# Patient Record
Sex: Female | Born: 1985 | Marital: Single | Smoking: Never smoker
Health system: Southern US, Community
[De-identification: ages and names within clinical notes are randomized; demographics above are authoritative.]

---

## 2013-03-24 ENCOUNTER — Other Ambulatory Visit (HOSPITAL_COMMUNITY)
Admission: RE | Admit: 2013-03-24 | Discharge: 2013-03-24 | Disposition: A | Discharge status: Home / Self Care | Payer: 59 | Source: Ambulatory Visit | Attending: Internal Medicine | Admitting: Internal Medicine

## 2013-03-24 DIAGNOSIS — Z01419 Encounter for gynecological examination (general) (routine) without abnormal findings: Secondary | ICD-10-CM | POA: Insufficient documentation

## 2013-04-26 ENCOUNTER — Emergency Department (HOSPITAL_COMMUNITY)
Admission: EM | Admit: 2013-04-26 | Discharge: 2013-04-26 | Disposition: A | Discharge status: Home / Self Care | Payer: Medicaid Other | Attending: Emergency Medicine | Admitting: Emergency Medicine

## 2013-04-26 ENCOUNTER — Emergency Department (HOSPITAL_COMMUNITY): Payer: Medicaid Other

## 2013-04-26 ENCOUNTER — Encounter (HOSPITAL_COMMUNITY): Payer: Self-pay | Admitting: Emergency Medicine

## 2013-04-26 DIAGNOSIS — N939 Abnormal uterine and vaginal bleeding, unspecified: Secondary | ICD-10-CM

## 2013-04-26 DIAGNOSIS — N938 Other specified abnormal uterine and vaginal bleeding: Secondary | ICD-10-CM | POA: Insufficient documentation

## 2013-04-26 DIAGNOSIS — Z79899 Other long term (current) drug therapy: Secondary | ICD-10-CM | POA: Insufficient documentation

## 2013-04-26 DIAGNOSIS — N949 Unspecified condition associated with female genital organs and menstrual cycle: Secondary | ICD-10-CM | POA: Insufficient documentation

## 2013-04-26 DIAGNOSIS — Z3202 Encounter for pregnancy test, result negative: Secondary | ICD-10-CM | POA: Insufficient documentation

## 2013-04-26 LAB — CBC WITH DIFFERENTIAL/PLATELET
Basophils Absolute: 0 10*3/uL (ref 0.0–0.1)
Lymphocytes Relative: 38 % (ref 12–46)
Lymphs Abs: 2.9 10*3/uL (ref 0.7–4.0)
Neutro Abs: 4.1 10*3/uL (ref 1.7–7.7)
Platelets: 310 10*3/uL (ref 150–400)
RBC: 4.86 MIL/uL (ref 3.87–5.11)
RDW: 14.1 % (ref 11.5–15.5)
WBC: 7.5 10*3/uL (ref 4.0–10.5)

## 2013-04-26 LAB — POCT PREGNANCY, URINE: Preg Test, Ur: NEGATIVE

## 2013-04-26 LAB — URINALYSIS, ROUTINE W REFLEX MICROSCOPIC
Glucose, UA: NEGATIVE mg/dL
Ketones, ur: NEGATIVE mg/dL
Nitrite: NEGATIVE
Protein, ur: 30 mg/dL — AB
Specific Gravity, Urine: 1.037 — ABNORMAL HIGH (ref 1.005–1.030)
Urobilinogen, UA: 1 mg/dL (ref 0.0–1.0)
pH: 5.5 (ref 5.0–8.0)

## 2013-04-26 LAB — BASIC METABOLIC PANEL
CO2: 26 mEq/L (ref 19–32)
Chloride: 102 mEq/L (ref 96–112)
Potassium: 3.6 mEq/L (ref 3.5–5.1)
Sodium: 135 mEq/L (ref 135–145)

## 2013-04-26 LAB — URINE MICROSCOPIC-ADD ON

## 2013-04-26 NOTE — ED Notes (Signed)
Pt complains of "heavy vaginal bleeding x 2 days" Pt reports that she is on hormone therapy and was told to come in due to heavy bleeding.

## 2013-04-26 NOTE — ED Provider Notes (Signed)
History     CSN: [id number]  Arrival date & time 04/26/13  1504   First MD Initiated Contact with Patient 04/26/13 1855      Chief Complaint  Patient presents with  . Vaginal Bleeding    (Consider location/radiation/quality/duration/timing/severity/associated sxs/prior treatment) HPI Patient presents to the ED with complaints of vaginal bleeding that started two days ago. She started Yasmin one month ago and and just got to the sugar pills 4-5 days ago.  She is having cramping but no pain. Her menstrual cycle is normally light. She is going through 1 pad every 2 hours. She denies dizziness weakness, nausea, vomiting, headache or confusion. Denies a hx of the same or any previous ob/gyn problems. nad vss  History reviewed. No pertinent past medical history.  History reviewed. No pertinent past surgical history.  No family history on file.  History  Substance Use Topics  . Smoking status: Never Smoker   . Smokeless tobacco: Not on file  . Alcohol Use: No    OB History   Grav Para Term Preterm Abortions TAB SAB Ect Mult Living                  Review of Systems  Review of Systems  Gen: no weight loss, fevers, chills, night sweats  Eyes: no discharge or drainage, no occular pain or visual changes  Nose: no epistaxis or rhinorrhea  Mouth: no dental pain, no sore throat  Neck: no neck pain  Lungs:No wheezing, coughing or hemoptysis CV: no chest pain, palpitations, dependent edema or orthopnea  Abd: no abdominal pain, nausea, vomiting  GU: no dysuria or gross hematuria + vaginal bleeding MSK:  No abnormalities  Neuro: no headache, no focal neurologic deficits  Skin: no abnormalities Psyche: negative.    Allergies  Penicillins  Home Medications   Current Outpatient Rx  Name  Route  Sig  Dispense  Refill  . Chaste Tree (VITEX EXTRACT PO)   Oral   Take 2 capsules by mouth daily.         . diclofenac (VOLTAREN) 50 MG EC tablet   Oral   Take 50 mg by mouth 2  (two) times daily.         . drospirenone-ethinyl estradiol (YASMIN,ZARAH,SYEDA) 3-0.03 MG tablet   Oral   Take by mouth daily.           BP 135/67  Pulse 56  Temp(Src) 98.3 F (36.8 C) (Oral)  Resp 16  SpO2 99%  LMP 04/23/2013  Physical Exam  Nursing note and vitals reviewed. Constitutional: She appears well-developed and well-nourished. No distress.  HENT:  Head: Normocephalic and atraumatic.  Eyes: Pupils are equal, round, and reactive to light.  Neck: Normal range of motion. Neck supple.  Cardiovascular: Normal rate and regular rhythm.   Pulmonary/Chest: Effort normal.  Abdominal: Soft.  Genitourinary: Uterus normal. Cervix exhibits no motion tenderness, no discharge and no friability. There is bleeding around the vagina. No tenderness around the vagina. No foreign body around the vagina. No signs of injury around the vagina. No vaginal discharge found.  Neurological: She is alert.  Skin: Skin is warm and dry.    ED Course  Procedures (including critical care time)  Labs Reviewed  URINALYSIS, ROUTINE W REFLEX MICROSCOPIC - Abnormal; Notable for the following:    Color, Urine RED (*)    APPearance TURBID (*)    Specific Gravity, Urine 1.037 (*)    Hgb urine dipstick LARGE (*)    Bilirubin  Urine SMALL (*)    Protein, ur 30 (*)    Leukocytes, UA SMALL (*)    All other components within normal limits  CBC WITH DIFFERENTIAL - Abnormal; Notable for the following:    MCH 25.9 (*)    All other components within normal limits  URINE MICROSCOPIC-ADD ON - Abnormal; Notable for the following:    Squamous Epithelial / LPF FEW (*)    All other components within normal limits  GC/CHLAMYDIA PROBE AMP  BASIC METABOLIC PANEL  POCT PREGNANCY, URINE   US Transvaginal Non-ob  04/26/2013  *RADIOLOGY REPORT*  Clinical Data:  Severe abdominal cramping and heavy vaginal bleeding for 1 week.  TRANSABDOMINAL AND TRANSVAGINAL ULTRASOUND OF PELVIS DOPPLER ULTRASOUND OF OVARIES   Technique:  Both transabdominal and transvaginal ultrasound examinations of the pelvis were performed. Transabdominal technique was performed for global imaging of the pelvis including uterus, ovaries, adnexal regions, and pelvic cul-de-sac.  It was necessary to proceed with endovaginal exam following the transabdominal exam to visualize the endometrium and ovaries to better advantage.  Color and duplex Doppler ultrasound was utilized to evaluate blood flow to the ovaries.  Comparison:  None.  Findings:  Uterus:  Normal in size and appearance, measuring 6.3 x 3.6 x 4.1 cm.  No myometrial abnormality identified.  Endometrium:  Homogeneous appearance measuring 5.8 mm in thickness.  Right ovary: Normal appearance measuring 2.8 x 3.0 x 2.4 cm.  No adnexal mass.  Left ovary:    Normal appearance measuring 2.7 x 2.1 x 2.7 cm.  No adnexal mass.  Pulsed Doppler evaluation demonstrates normal low-resistance arterial and venous waveforms in both ovaries.  IMPRESSION: Normal exam.  No evidence of pelvic mass or other significant abnormality.  No sonographic evidence for ovarian torsion.   Original Report Authenticated By: Carey Bullocks, M.D.    US Pelvis Complete  04/26/2013  *RADIOLOGY REPORT*  Clinical Data:  Severe abdominal cramping and heavy vaginal bleeding for 1 week.  TRANSABDOMINAL AND TRANSVAGINAL ULTRASOUND OF PELVIS DOPPLER ULTRASOUND OF OVARIES  Technique:  Both transabdominal and transvaginal ultrasound examinations of the pelvis were performed. Transabdominal technique was performed for global imaging of the pelvis including uterus, ovaries, adnexal regions, and pelvic cul-de-sac.  It was necessary to proceed with endovaginal exam following the transabdominal exam to visualize the endometrium and ovaries to better advantage.  Color and duplex Doppler ultrasound was utilized to evaluate blood flow to the ovaries.  Comparison:  None.  Findings:  Uterus:  Normal in size and appearance, measuring 6.3 x 3.6 x 4.1  cm.  No myometrial abnormality identified.  Endometrium:  Homogeneous appearance measuring 5.8 mm in thickness.  Right ovary: Normal appearance measuring 2.8 x 3.0 x 2.4 cm.  No adnexal mass.  Left ovary:    Normal appearance measuring 2.7 x 2.1 x 2.7 cm.  No adnexal mass.  Pulsed Doppler evaluation demonstrates normal low-resistance arterial and venous waveforms in both ovaries.  IMPRESSION: Normal exam.  No evidence of pelvic mass or other significant abnormality.  No sonographic evidence for ovarian torsion.   Original Report Authenticated By: Carey Bullocks, M.D.    [place] Art/ven Flow Abd Pelv Doppler  04/26/2013  *RADIOLOGY REPORT*  Clinical Data:  Severe abdominal cramping and heavy vaginal bleeding for 1 week.  TRANSABDOMINAL AND TRANSVAGINAL ULTRASOUND OF PELVIS DOPPLER ULTRASOUND OF OVARIES  Technique:  Both transabdominal and transvaginal ultrasound examinations of the pelvis were performed. Transabdominal technique was performed for global imaging of the pelvis including uterus, ovaries, adnexal regions,  and pelvic cul-de-sac.  It was necessary to proceed with endovaginal exam following the transabdominal exam to visualize the endometrium and ovaries to better advantage.  Color and duplex Doppler ultrasound was utilized to evaluate blood flow to the ovaries.  Comparison:  None.  Findings:  Uterus:  Normal in size and appearance, measuring 6.3 x 3.6 x 4.1 cm.  No myometrial abnormality identified.  Endometrium:  Homogeneous appearance measuring 5.8 mm in thickness.  Right ovary: Normal appearance measuring 2.8 x 3.0 x 2.4 cm.  No adnexal mass.  Left ovary:    Normal appearance measuring 2.7 x 2.1 x 2.7 cm.  No adnexal mass.  Pulsed Doppler evaluation demonstrates normal low-resistance arterial and venous waveforms in both ovaries.  IMPRESSION: Normal exam.  No evidence of pelvic mass or other significant abnormality.  No sonographic evidence for ovarian torsion.   Original Report Authenticated By: Carey Bullocks, M.D.      1. Abnormal uterine bleeding       MDM  Patients work-up is benign. [facility] on [facility] and she recommend the patient start a new pack and skip the sugar pills until she speaks with her PCP to have medication changed.  I discussed plan with patient and she lets me know that she will not take the Yasmin ever again. I tried to explain how this will help and why she needs to take it but she refuses. I told her that it is up to her but she needs to see her PCP or referred gynecologist as soon as possible. VSS  Pt has been advised of the symptoms that warrant their return to the ED. Patient has voiced understanding and has agreed to follow-up with the PCP or specialist.         Dorthula Matas, PA-C 04/26/13 2213

## 2013-04-28 LAB — GC/CHLAMYDIA PROBE AMP
CT Probe RNA: POSITIVE — AB
GC Probe RNA: NEGATIVE

## 2013-04-29 ENCOUNTER — Telehealth (HOSPITAL_COMMUNITY): Payer: Self-pay | Admitting: Emergency Medicine

## 2013-04-29 NOTE — ED Notes (Signed)
Patient has +Chlamydia. 

## 2013-04-29 NOTE — ED Notes (Signed)
+  Chlamydia. Chart sent to EDP office for review. [facility]HS attached. 

## 2013-05-01 ENCOUNTER — Telehealth (HOSPITAL_COMMUNITY): Payer: Self-pay | Admitting: Emergency Medicine

## 2013-05-01 NOTE — ED Notes (Signed)
Chart returned from EDP office. Per Trixie Dredge PA-C, Azithromycin 1 gram PO x single dose.

## 2013-05-04 NOTE — ED Provider Notes (Signed)
Medical screening examination/treatment/procedure(s) were performed by non-physician practitioner and as supervising physician I was immediately available for consultation/collaboration.  Isael Stille, MD 05/04/13 2317 

## 2013-05-17 ENCOUNTER — Encounter: Payer: 59 | Admitting: Obstetrics & Gynecology

## 2017-09-09 DIAGNOSIS — E282 Polycystic ovarian syndrome: Secondary | ICD-10-CM | POA: Diagnosis not present

## 2017-09-09 DIAGNOSIS — N939 Abnormal uterine and vaginal bleeding, unspecified: Secondary | ICD-10-CM | POA: Diagnosis not present

## 2017-09-11 ENCOUNTER — Other Ambulatory Visit (HOSPITAL_COMMUNITY): Payer: Self-pay | Admitting: Obstetrics and Gynecology

## 2017-09-11 DIAGNOSIS — Z3141 Encounter for fertility testing: Secondary | ICD-10-CM

## 2017-09-17 ENCOUNTER — Ambulatory Visit (HOSPITAL_COMMUNITY)
Admission: RE | Admit: 2017-09-17 | Discharge: 2017-09-17 | Disposition: A | Discharge status: Home / Self Care | Payer: BLUE CROSS/BLUE SHIELD | Source: Ambulatory Visit | Attending: Obstetrics and Gynecology | Admitting: Obstetrics and Gynecology

## 2017-09-17 DIAGNOSIS — Z3141 Encounter for fertility testing: Secondary | ICD-10-CM

## 2017-09-17 MED ORDER — IOPAMIDOL (ISOVUE-300) INJECTION 61%
30.0000 mL | Freq: Once | INTRAVENOUS | Status: AC | PRN
  Administered 2017-09-17: 30 mL

## 2017-10-22 DIAGNOSIS — E282 Polycystic ovarian syndrome: Secondary | ICD-10-CM | POA: Diagnosis not present

## 2017-10-22 DIAGNOSIS — N939 Abnormal uterine and vaginal bleeding, unspecified: Secondary | ICD-10-CM | POA: Diagnosis not present

## 2017-11-11 DIAGNOSIS — Z6841 Body Mass Index (BMI) 40.0 and over, adult: Secondary | ICD-10-CM | POA: Diagnosis not present

## 2017-11-11 DIAGNOSIS — Z13220 Encounter for screening for lipoid disorders: Secondary | ICD-10-CM | POA: Diagnosis not present

## 2017-11-11 DIAGNOSIS — Z713 Dietary counseling and surveillance: Secondary | ICD-10-CM | POA: Diagnosis not present

## 2017-11-11 DIAGNOSIS — Z131 Encounter for screening for diabetes mellitus: Secondary | ICD-10-CM | POA: Diagnosis not present

## 2017-11-11 DIAGNOSIS — Z136 Encounter for screening for cardiovascular disorders: Secondary | ICD-10-CM | POA: Diagnosis not present

## 2017-11-17 DIAGNOSIS — M609 Myositis, unspecified: Secondary | ICD-10-CM | POA: Diagnosis not present

## 2018-01-17 DIAGNOSIS — Z3202 Encounter for pregnancy test, result negative: Secondary | ICD-10-CM | POA: Diagnosis not present

## 2018-01-17 DIAGNOSIS — Z113 Encounter for screening for infections with a predominantly sexual mode of transmission: Secondary | ICD-10-CM | POA: Diagnosis not present

## 2018-01-17 DIAGNOSIS — Z114 Encounter for screening for human immunodeficiency virus [HIV]: Secondary | ICD-10-CM | POA: Diagnosis not present

## 2018-01-30 DIAGNOSIS — N979 Female infertility, unspecified: Secondary | ICD-10-CM | POA: Diagnosis not present

## 2018-02-09 DIAGNOSIS — Z114 Encounter for screening for human immunodeficiency virus [HIV]: Secondary | ICD-10-CM | POA: Diagnosis not present

## 2018-02-09 DIAGNOSIS — Z0183 Encounter for blood typing: Secondary | ICD-10-CM | POA: Diagnosis not present

## 2018-02-09 DIAGNOSIS — N979 Female infertility, unspecified: Secondary | ICD-10-CM | POA: Diagnosis not present

## 2018-02-09 DIAGNOSIS — Z113 Encounter for screening for infections with a predominantly sexual mode of transmission: Secondary | ICD-10-CM | POA: Diagnosis not present

## 2018-02-09 DIAGNOSIS — Z0184 Encounter for antibody response examination: Secondary | ICD-10-CM | POA: Diagnosis not present

## 2018-02-11 DIAGNOSIS — N979 Female infertility, unspecified: Secondary | ICD-10-CM | POA: Diagnosis not present

## 2018-02-13 DIAGNOSIS — N970 Female infertility associated with anovulation: Secondary | ICD-10-CM | POA: Diagnosis not present

## 2018-03-05 DIAGNOSIS — Z3202 Encounter for pregnancy test, result negative: Secondary | ICD-10-CM | POA: Diagnosis not present

## 2018-03-05 DIAGNOSIS — N979 Female infertility, unspecified: Secondary | ICD-10-CM | POA: Diagnosis not present

## 2018-03-16 DIAGNOSIS — N979 Female infertility, unspecified: Secondary | ICD-10-CM | POA: Diagnosis not present

## 2018-05-26 DIAGNOSIS — L509 Urticaria, unspecified: Secondary | ICD-10-CM | POA: Diagnosis not present

## 2018-06-09 DIAGNOSIS — J029 Acute pharyngitis, unspecified: Secondary | ICD-10-CM | POA: Diagnosis not present

## 2018-06-16 ENCOUNTER — Encounter (HOSPITAL_COMMUNITY): Payer: Self-pay | Admitting: Emergency Medicine

## 2018-06-16 ENCOUNTER — Emergency Department (HOSPITAL_COMMUNITY)
Admission: EM | Admit: 2018-06-16 | Discharge: 2018-06-16 | Disposition: A | Discharge status: Home / Self Care | Payer: BLUE CROSS/BLUE SHIELD | Attending: Emergency Medicine | Admitting: Emergency Medicine

## 2018-06-16 ENCOUNTER — Other Ambulatory Visit: Payer: Self-pay

## 2018-06-16 ENCOUNTER — Emergency Department (HOSPITAL_COMMUNITY): Payer: BLUE CROSS/BLUE SHIELD

## 2018-06-16 DIAGNOSIS — Z79899 Other long term (current) drug therapy: Secondary | ICD-10-CM | POA: Diagnosis not present

## 2018-06-16 DIAGNOSIS — H5789 Other specified disorders of eye and adnexa: Secondary | ICD-10-CM | POA: Diagnosis not present

## 2018-06-16 DIAGNOSIS — L03213 Periorbital cellulitis: Secondary | ICD-10-CM | POA: Insufficient documentation

## 2018-06-16 DIAGNOSIS — H02843 Edema of right eye, unspecified eyelid: Secondary | ICD-10-CM | POA: Diagnosis not present

## 2018-06-16 DIAGNOSIS — H5711 Ocular pain, right eye: Secondary | ICD-10-CM | POA: Diagnosis not present

## 2018-06-16 DIAGNOSIS — H538 Other visual disturbances: Secondary | ICD-10-CM | POA: Diagnosis not present

## 2018-06-16 DIAGNOSIS — R600 Localized edema: Secondary | ICD-10-CM | POA: Diagnosis not present

## 2018-06-16 LAB — CBC WITH DIFFERENTIAL/PLATELET
Basophils Absolute: 0 10*3/uL (ref 0.0–0.1)
Basophils Relative: 0 %
EOS PCT: 0 %
Eosinophils Absolute: 0 10*3/uL (ref 0.0–0.7)
HCT: 41 % (ref 36.0–46.0)
Hemoglobin: 13.1 g/dL (ref 12.0–15.0)
LYMPHS ABS: 1.8 10*3/uL (ref 0.7–4.0)
Lymphocytes Relative: 14 %
MCH: 25.1 pg — AB (ref 26.0–34.0)
MCHC: 32 g/dL (ref 30.0–36.0)
MCV: 78.7 fL (ref 78.0–100.0)
MONOS PCT: 4 %
Monocytes Absolute: 0.4 10*3/uL (ref 0.1–1.0)
Neutro Abs: 10.2 10*3/uL — ABNORMAL HIGH (ref 1.7–7.7)
Neutrophils Relative %: 82 %
PLATELETS: 343 10*3/uL (ref 150–400)
RBC: 5.21 MIL/uL — ABNORMAL HIGH (ref 3.87–5.11)
RDW: 15.5 % (ref 11.5–15.5)
WBC: 12.5 10*3/uL — ABNORMAL HIGH (ref 4.0–10.5)

## 2018-06-16 LAB — BASIC METABOLIC PANEL WITH GFR
Anion gap: 7 (ref 5–15)
BUN: 11 mg/dL (ref 6–20)
CO2: 25 mmol/L (ref 22–32)
Calcium: 9 mg/dL (ref 8.9–10.3)
Chloride: 108 mmol/L (ref 101–111)
Creatinine, Ser: 0.89 mg/dL (ref 0.44–1.00)
GFR calc Af Amer: >60 mL/min
GFR calc non Af Amer: >60 mL/min
Glucose, Bld: 105 mg/dL — ABNORMAL HIGH (ref 65–99)
Potassium: 4 mmol/L (ref 3.5–5.1)
Sodium: 140 mmol/L (ref 135–145)

## 2018-06-16 MED ORDER — CLINDAMYCIN HCL 150 MG PO CAPS: 450.0000 mg | ORAL_CAPSULE | Freq: Three times a day (TID) | ORAL | 0 refills | Status: AC

## 2018-06-16 MED ORDER — IOHEXOL 300 MG/ML  SOLN
75.0000 mL | Freq: Once | INTRAMUSCULAR | Status: AC | PRN
  Administered 2018-06-16: 75 mL via INTRAVENOUS

## 2018-06-16 NOTE — ED Triage Notes (Signed)
Pt reports right eye swelling since she woke up this morning. Eye is painful due to swelling. Reports that she had issues with hives and prior swelling for weeks and took prednisone for it.

## 2018-06-16 NOTE — ED Provider Notes (Signed)
[facility]-EMERGENCY DEPT Provider Note   CSN: [id number] Arrival date & time: 06/16/18  1048     History   Chief Complaint Chief Complaint  Patient presents with  . Facial Swelling    HPI Vicki James is a 32 y.o. female here for evaluation of right eye swelling onset this morning when she woke up.  Associated with mild redness, itching and pain.  Pain is moderate.  Aggravated by direct palpation.  Is unable to tell if her vision is any more blurred, she wears glasses at baseline and does not have them with her today.  Reports intermittent, hives to her back and upper extremities since May.  She has been seen for this at [facility] and was prescribed prednisone however states she has been taking it differently because it was making her queasy.  She is still finishing her prednisone taper.  Since, she has developed bilateral lip swelling approximately 2 weeks ago and also had swelling to her uvula.  She was seen at an outside ER for this and was told to use lozenges.  States that hives started the day after she traveled to [place] and stated a different hotel.  Her knees had bug bites on her as well.  Other than that, she has no new exposures to pets, fluids, medications, topical agents.  She is denying sore throat, throat swelling, difficulty breathing, chest pain, shortness of breath, abdominal pain, nausea, vomiting.  No alleviating factors.  Onset was sudden. No trauma to eye.   HPI  History reviewed. No pertinent past medical history.  There are no active problems to display for this patient.   History reviewed. No pertinent surgical history.   OB History   None      Home Medications    Prior to Admission medications   Medication Sig Start Date End Date Taking? Authorizing Provider  predniSONE (DELTASONE) 10 MG tablet Take 10-20 mg by mouth daily. 05/26/18  Yes [provider]  clindamycin (CLEOCIN) 150 MG capsule Take 3 capsules (450 mg  total) by mouth 3 (three) times daily for 7 days. 06/16/18 06/23/18  Liberty Handy, PA-C    Family History No family history on file.  Social History Social History   Tobacco Use  . Smoking status: Never Smoker  . Smokeless tobacco: Never Used  Substance Use Topics  . Alcohol use: No  . Drug use: No     Allergies   Latex and Penicillins   Review of Systems Review of Systems  Eyes: Positive for pain, itching and visual disturbance.       Eye redness and swelling  All other systems reviewed and are negative.    Physical Exam Updated Vital Signs BP (!) 145/76 (BP Location: Left Arm)   Pulse 86   Temp 98.5 F (36.9 C) (Oral)   Resp 12   Ht 5\' 6"  (1.676 m)   Wt (!) 140.6 kg (310 lb)   LMP 05/04/2018   SpO2 96%   BMI 50.04 kg/m   Physical Exam  Constitutional: She is oriented to person, place, and time. She appears well-developed and well-nourished. No distress.  NAD.  HENT:  Head: Normocephalic and atraumatic.  Right Ear: External ear normal.  Left Ear: External ear normal.  Nose: Nose normal.  Eyes: Conjunctivae and EOM are normal. No scleral icterus.  Moderate edema and erythema to upper and lower eye lids, eye lid is shut. Mild local tenderness. Eye able to open eye when looking  up. Conjunctiva and sclera white without prominent vessels. No discharge. EOMS and PERRL intact bilaterally.   Neck: Normal range of motion. Neck supple.  Cardiovascular: Normal rate, regular rhythm and normal heart sounds.  No murmur heard. Pulmonary/Chest: Effort normal and breath sounds normal. She has no wheezes.  Musculoskeletal: Normal range of motion. She exhibits no deformity.  Neurological: She is alert and oriented to person, place, and time.  Skin: Skin is warm and dry. Capillary refill takes less than 2 seconds.  Psychiatric: She has a normal mood and affect. Her behavior is normal. Judgment and thought content normal.  Nursing note and vitals reviewed.    ED  Treatments / Results  Labs (all labs ordered are listed, but only abnormal results are displayed) Labs Reviewed  BASIC METABOLIC PANEL - Abnormal; Notable for the following components:      Result Value   Glucose, Bld 105 (*)    All other components within normal limits  CBC WITH DIFFERENTIAL/PLATELET - Abnormal; Notable for the following components:   WBC 12.5 (*)    RBC 5.21 (*)    MCH 25.1 (*)    Neutro Abs 10.2 (*)    All other components within normal limits  CBC WITH DIFFERENTIAL/PLATELET    EKG None  Radiology Ct Orbits W Contrast  Result Date: 06/16/2018 CLINICAL DATA:  Eyelid inflammation with right-sided redness and swelling beginning this morning EXAM: CT ORBITS WITH CONTRAST TECHNIQUE: Multidetector CT images was performed according to the standard protocol following intravenous contrast administration. CONTRAST:  75mL OMNIPAQUE IOHEXOL 300 MG/ML  SOLN COMPARISON:  None. FINDINGS: Orbits: Skin thickening and subcutaneous reticulation about the right orbit. No postseptal stranding. Normal appearance of the globes, extraocular muscles, optic nerve sheath complexes, and superior ophthalmic veins. Symmetric lacrimal gland size. Negative for collection. Visualized sinuses: Clear Soft tissues: Periorbital findings as noted above. Adenoid thickening symmetrically. Limited intracranial: Negative IMPRESSION: Preseptal swelling/cellulitis on the right.  Negative for abscess. Electronically Signed   By: Marnee Spring M.D.   On: 06/16/2018 16:16    Procedures Procedures (including critical care time)  Medications Ordered in ED Medications  iohexol (OMNIPAQUE) 300 MG/ML solution 75 mL (75 mLs Intravenous Contrast Given 06/16/18 1555)     Initial Impression / Assessment and Plan / ED Course  I have reviewed the triage vital signs and the nursing notes.  Pertinent labs & imaging results that were available during my care of the patient were reviewed by me and considered in my  medical decision making (see chart for details).  Clinical Course as of Jun 17 1735  Tue Jun 16, 2018  1725 IMPRESSION: Preseptal swelling/cellulitis on the right. Negative for abscess.    CT ORBITS W CONTRAST [CG]    Clinical Course User Index [CG] Liberty Handy, PA-C   32 year old here with right-sided eye edema, erythema, itching and pain sudden onset.  In setting of intermittent urticarial rash for the last few months, lip swelling.  Currently on prednisone taper.  She denies trauma.  No constitutional symptoms.  No sudden changes to her vision.  Does not wear contacts.  Differential diagnosis includes preseptal cellulitis versus orbital cellulitis versus allergic facial angioedema. She is already on prednisone taper. Patient discussed with Dr. Particia Nearing.  We will obtain screening labs and CT orbits to rule out infection.  1734: CT orbits show preseptal cellulitis/swelling.  No deeper infection.  Will discharge with clindamycin and strict return precautions.  She has no other facial or oral angioedema.  No  respiratory compromise.  Conjunctiva, sclera, pupils normal.  Her vision is at baseline.  No headache. Will recommend to continue prednisone taper and add antihistamine, ice.  Strict ED return precautions given.  Patient is in agreement.  Final Clinical Impressions(s) / ED Diagnoses   Final diagnoses:  Preseptal cellulitis of right eye    ED Discharge Orders        Ordered    clindamycin (CLEOCIN) 150 MG capsule  3 times daily     06/16/18 1725       Liberty HandyGibbons, Markeya Mincy J, PA-C 06/16/18 1737    Jacalyn LefevreHaviland, Julie, MD 06/17/18 [phone]

## 2018-06-16 NOTE — ED Notes (Signed)
Waldo LaineKaitlin, RN attempted IV access x1 without success

## 2018-06-16 NOTE — ED Notes (Signed)
Attempted an IV x 2 with no success. Notified Matt, RN for an ultrasound guided IV.

## 2018-06-16 NOTE — Discharge Instructions (Addendum)
You were seen in the ER for right eyelid swelling.  CT scan shows this is most likely an infectious process.  Take clindamycin as prescribed.  To help with your intermittent rash, itching continue taking the prednisone that you have been prescribed.  Additionally take a daily pepcid (famotidine) 20 mg daily. You can apply topical anti itch cream (benadryl or hydrocortisone).   Return to the ER if you have worsening eye lid swelling, or swelling to the other eye, facial lip or tongue swelling, difficulty breathing, chest pain, shortness of breath, abdominal pain, headache, vomiting

## 2018-09-07 DIAGNOSIS — E282 Polycystic ovarian syndrome: Secondary | ICD-10-CM | POA: Diagnosis not present

## 2018-09-07 DIAGNOSIS — Z0000 Encounter for general adult medical examination without abnormal findings: Secondary | ICD-10-CM | POA: Diagnosis not present

## 2018-09-07 DIAGNOSIS — Z23 Encounter for immunization: Secondary | ICD-10-CM | POA: Diagnosis not present

## 2018-09-13 ENCOUNTER — Emergency Department (HOSPITAL_COMMUNITY): Payer: BLUE CROSS/BLUE SHIELD

## 2018-09-13 ENCOUNTER — Other Ambulatory Visit: Payer: Self-pay

## 2018-09-13 ENCOUNTER — Encounter (HOSPITAL_COMMUNITY): Payer: Self-pay | Admitting: Emergency Medicine

## 2018-09-13 ENCOUNTER — Emergency Department (HOSPITAL_COMMUNITY)
Admission: EM | Admit: 2018-09-13 | Discharge: 2018-09-13 | Disposition: A | Discharge status: Home / Self Care | Payer: BLUE CROSS/BLUE SHIELD | Attending: Emergency Medicine | Admitting: Emergency Medicine

## 2018-09-13 DIAGNOSIS — S8992XA Unspecified injury of left lower leg, initial encounter: Secondary | ICD-10-CM | POA: Diagnosis not present

## 2018-09-13 DIAGNOSIS — Y92512 Supermarket, store or market as the place of occurrence of the external cause: Secondary | ICD-10-CM | POA: Insufficient documentation

## 2018-09-13 DIAGNOSIS — W19XXXA Unspecified fall, initial encounter: Secondary | ICD-10-CM

## 2018-09-13 DIAGNOSIS — Y939 Activity, unspecified: Secondary | ICD-10-CM | POA: Diagnosis not present

## 2018-09-13 DIAGNOSIS — M25562 Pain in left knee: Secondary | ICD-10-CM | POA: Insufficient documentation

## 2018-09-13 DIAGNOSIS — W010XXA Fall on same level from slipping, tripping and stumbling without subsequent striking against object, initial encounter: Secondary | ICD-10-CM | POA: Insufficient documentation

## 2018-09-13 DIAGNOSIS — M5441 Lumbago with sciatica, right side: Secondary | ICD-10-CM | POA: Insufficient documentation

## 2018-09-13 DIAGNOSIS — Y999 Unspecified external cause status: Secondary | ICD-10-CM | POA: Diagnosis not present

## 2018-09-13 DIAGNOSIS — M25551 Pain in right hip: Secondary | ICD-10-CM

## 2018-09-13 DIAGNOSIS — M25561 Pain in right knee: Secondary | ICD-10-CM | POA: Diagnosis not present

## 2018-09-13 DIAGNOSIS — S79911A Unspecified injury of right hip, initial encounter: Secondary | ICD-10-CM | POA: Diagnosis not present

## 2018-09-13 DIAGNOSIS — S8991XA Unspecified injury of right lower leg, initial encounter: Secondary | ICD-10-CM | POA: Diagnosis not present

## 2018-09-13 LAB — PREGNANCY, URINE: PREG TEST UR: NEGATIVE

## 2018-09-13 MED ORDER — IBUPROFEN 600 MG PO TABS: 600.0000 mg | ORAL_TABLET | Freq: Four times a day (QID) | ORAL | 0 refills | Status: AC | PRN

## 2018-09-13 MED ORDER — ACETAMINOPHEN 500 MG PO TABS: 500.0000 mg | ORAL_TABLET | Freq: Four times a day (QID) | ORAL | 0 refills | Status: AC | PRN

## 2018-09-13 NOTE — ED Triage Notes (Signed)
Pt slipped and fell on grocery store floor onto both knees. C/o bilateral knee pain.

## 2018-09-13 NOTE — Discharge Instructions (Signed)
Your x-rays today did not show any sign of fracture or dislocation.  1. Medications: Alternate 600 mg of ibuprofen and [phone] mg of Tylenol every 3 hours as needed for pain. Do not exceed 4000 mg of Tylenol daily.  Take ibuprofen with food to avoid upset stomach issues.  2. Treatment: rest, ice, elevate when you are not walking and use brace and crutches for comfort when you are walking, drink plenty of fluids, gentle stretching (I have attached some exercises but you can also YouTube physical therapy exercises for the back, hip, and knee) 3. Follow Up: Please followup with orthopedics as directed or your PCP in 1 week if no improvement for discussion of your diagnoses and further evaluation after today's visit; if you do not have a primary care doctor use the resource guide provided to find one; Please return to the ER for worsening symptoms or other concerns such as worsening swelling, redness of the skin, fevers, loss of pulses, or loss of feeling

## 2018-09-13 NOTE — ED Provider Notes (Signed)
[facility]-EMERGENCY DEPT Provider Note   CSN: [id number] Arrival date & time: 09/13/18  1707     History   Chief Complaint Chief Complaint  Patient presents with  . Fall  . Knee Pain    bilateral knees    HPI Vicki James is a 32 y.o. female presents today for evaluation of acute onset, constant bilateral knee pain, right worse than left secondary to fall which occurred around 3 PM today.  She states that she was at a grocery store where there was apparently some watermelon juice on the floor which she did not notice.  She states that she slipped on the juice, landing on her right knee.  Denies head injury or loss of consciousness.  She notes pain to the left knee is mild and is more severe on the right.  Pain on the right radiates up to the right hip and low back and down to the toes.  She notes numbness and tingling to the anterior aspect of the right knee.  Pain worsens with bending and attempts to ambulate.  She denies bowel or bladder incontinence, saddle anesthesia, fevers.  No medications for this prior to arrival.  The history is provided by the patient.    History reviewed. No pertinent past medical history.  There are no active problems to display for this patient.   History reviewed. No pertinent surgical history.   OB History   None      Home Medications    Prior to Admission medications   Medication Sig Start Date End Date Taking? Authorizing Provider  acetaminophen (TYLENOL) 500 MG tablet Take 1 tablet (500 mg total) by mouth every 6 (six) hours as needed. 09/13/18   Miyonna Ormiston A, PA-C  ibuprofen (ADVIL,MOTRIN) 600 MG tablet Take 1 tablet (600 mg total) by mouth every 6 (six) hours as needed. 09/13/18   Jeoffrey Eleazer A, PA-C  predniSONE (DELTASONE) 10 MG tablet Take 10-20 mg by mouth daily. 05/26/18   [provider]    Family History History reviewed. No pertinent family history.  Social History Social History   Tobacco Use    . Smoking status: Never Smoker  . Smokeless tobacco: Never Used  Substance Use Topics  . Alcohol use: No  . Drug use: No     Allergies   Latex and Penicillins   Review of Systems Review of Systems  Constitutional: Negative for fever.  Musculoskeletal: Positive for arthralgias and back pain.  Neurological: Positive for numbness. Negative for syncope, weakness and headaches.     Physical Exam Updated Vital Signs BP (!) 157/92 (BP Location: Left Arm)   Pulse 80   Temp 98.3 F (36.8 C) (Oral)   Resp 20   Ht 5\' 8"  (1.727 m)   Wt (!) 140.2 kg   LMP 09/01/2018 (Exact Date)   SpO2 100%   BMI 46.98 kg/m   Physical Exam  Constitutional: She is oriented to person, place, and time. She appears well-developed and well-nourished. No distress.  Obese female, resting comfortably in chair  HENT:  Head: Normocephalic and atraumatic.  Eyes: Conjunctivae are normal. Right eye exhibits no discharge. Left eye exhibits no discharge.  Neck: No JVD present. No tracheal deviation present.  Cardiovascular: Normal rate and intact distal pulses.  2+ DP/PT pulses bilaterally, Homans sign absent bilaterally, no lower extremity edema, no palpable cords, compartments are soft   Pulmonary/Chest: Effort normal.  Abdominal: She exhibits no distension.  Musculoskeletal: She exhibits tenderness. She exhibits no  edema.       Right hip: She exhibits normal strength, no crepitus and no deformity.       Right knee: She exhibits decreased range of motion. She exhibits no ecchymosis, no deformity, no laceration, no erythema, normal alignment, no LCL laxity, normal patellar mobility, normal meniscus and no MCL laxity. Tenderness found. Medial joint line, lateral joint line, MCL, LCL and patellar tendon tenderness noted.       Left knee: She exhibits normal range of motion, no ecchymosis, no deformity, no laceration, no erythema, normal alignment, no LCL laxity, normal patellar mobility, normal meniscus and no  MCL laxity. Tenderness found. Medial joint line, lateral joint line and patellar tendon tenderness noted.  No midline spine tenderness, right paralumbar muscle tenderness noted in right SI joint tenderness noted.  5/5 strength of BLE major muscle groups.  There is diffuse tenderness to palpation of the right knee and tenderness palpation of the anterior aspect of the left knee overlying the patella.  No varus or valgus instability, negative anterior/posterior drawer test bilaterally.  Decreased range of motion with flexion of the right knee secondary to pain.  Patient is able to extend both knees against gravity.  No quadriceps tendon deformity.  There is tenderness to palpation of the lateral and anterior aspect of the right hip, pain worsens with flexion, internal and external rotation of the right hip.  Neurological: She is alert and oriented to person, place, and time. No cranial nerve deficit. She exhibits normal muscle tone.  Fluent speech, no facial droop, altered sensation to soft touch of the anterior aspect of the right knee.  Otherwise sensation intact to soft touch of extremities.  Ambulates with an antalgic gait, has difficulty with Heel Walk and Toe Walk secondary to right knee pain.  Skin: Skin is warm and dry. No erythema.  Psychiatric: She has a normal mood and affect. Her behavior is normal.  Nursing note and vitals reviewed.    ED Treatments / Results  Labs (all labs ordered are listed, but only abnormal results are displayed) Labs Reviewed  PREGNANCY, URINE  POC URINE PREG, ED    EKG None  Radiology Dg Knee Complete 4 Views Left  Result Date: 09/13/2018 CLINICAL DATA:  Patient slipped and fell on grocery store floor onto both knees. EXAM: LEFT KNEE - COMPLETE 4+ VIEW COMPARISON:  None. FINDINGS: No evidence of fracture, dislocation, or joint effusion. No evidence of arthropathy or other focal bone abnormality. Soft tissues are unremarkable. IMPRESSION: Negative.  Electronically Signed   By: Elberta Fortis M.D.   On: 09/13/2018 18:34   Dg Knee Complete 4 Views Right  Result Date: 09/13/2018 CLINICAL DATA:  Slipped and fell in grocery store for with bilateral knee pain. EXAM: RIGHT KNEE - COMPLETE 4+ VIEW COMPARISON:  None. FINDINGS: No evidence of fracture, dislocation, or joint effusion. No evidence of arthropathy or other focal bone abnormality. Soft tissues are unremarkable. IMPRESSION: Negative. Electronically Signed   By: Elberta Fortis M.D.   On: 09/13/2018 18:35   Dg Hip Unilat With Pelvis 2-3 Views Right  Result Date: 09/13/2018 CLINICAL DATA:  Slip and fall injury. Bilateral knee pain. Right hip pain. EXAM: DG HIP (WITH OR WITHOUT PELVIS) 2-3V RIGHT COMPARISON:  None. FINDINGS: There is no evidence of hip fracture or dislocation. There is no evidence of arthropathy or other focal bone abnormality. IMPRESSION: Negative. Electronically Signed   By: Burman Nieves M.D.   On: 09/13/2018 21:35    Procedures Procedures (including  critical care time)  Medications Ordered in ED Medications - No data to display   Initial Impression / Assessment and Plan / ED Course  I have reviewed the triage vital signs and the nursing notes.  Pertinent labs & imaging results that were available during my care of the patient were reviewed by me and considered in my medical decision making (see chart for details).     Patient with bilateral knee and right hip pain secondary to mechanical fall.  She is afebrile, mildly hypertensive in the ED though does appear uncomfortable.  She is neurovascularly intact and ambulatory without difficulty.  Radiographs show no acute osseous abnormality involving the knees or right hip.  No midline spine tenderness.  No concern for DVT, septic joint, osteomyelitis. Pt advised to follow up with orthopedics if symptoms persist for possibility of missed fracture diagnosis. Patient given brace and crutches while in ED, conservative therapy  recommended and discussed.  Discussed strict ED return precautions. Pt verbalized understanding of and agreement with plan and is safe for discharge home at this time.   Final Clinical Impressions(s) / ED Diagnoses   Final diagnoses:  Fall, initial encounter  Acute pain of both knees  Acute right hip pain  Acute right-sided low back pain with right-sided sciatica    ED Discharge Orders         Ordered    ibuprofen (ADVIL,MOTRIN) 600 MG tablet  Every 6 hours PRN     09/13/18 2144    acetaminophen (TYLENOL) 500 MG tablet  Every 6 hours PRN     09/13/18 2144           Jeanie SewerFawze, Da Michelle A, PA-C 09/14/18 1449    Loren RacerYelverton, David, MD 09/18/18 1709

## 2018-09-15 DIAGNOSIS — M25561 Pain in right knee: Secondary | ICD-10-CM | POA: Diagnosis not present

## 2018-09-16 ENCOUNTER — Ambulatory Visit (INDEPENDENT_AMBULATORY_CARE_PROVIDER_SITE_OTHER): Payer: BLUE CROSS/BLUE SHIELD | Admitting: Family Medicine

## 2018-09-16 ENCOUNTER — Encounter (INDEPENDENT_AMBULATORY_CARE_PROVIDER_SITE_OTHER): Payer: Self-pay | Admitting: Family Medicine

## 2018-09-16 ENCOUNTER — Other Ambulatory Visit (INDEPENDENT_AMBULATORY_CARE_PROVIDER_SITE_OTHER): Payer: Self-pay

## 2018-09-16 ENCOUNTER — Ambulatory Visit (INDEPENDENT_AMBULATORY_CARE_PROVIDER_SITE_OTHER): Payer: Self-pay

## 2018-09-16 DIAGNOSIS — M25561 Pain in right knee: Secondary | ICD-10-CM

## 2018-09-16 DIAGNOSIS — M5441 Lumbago with sciatica, right side: Secondary | ICD-10-CM

## 2018-09-16 DIAGNOSIS — M25562 Pain in left knee: Secondary | ICD-10-CM | POA: Diagnosis not present

## 2018-09-16 MED ORDER — BACLOFEN 10 MG PO TABS: 10.0000 mg | ORAL_TABLET | Freq: Three times a day (TID) | ORAL | 3 refills | Status: AC | PRN

## 2018-09-16 MED ORDER — PREDNISONE 10 MG PO TABS: ORAL_TABLET | ORAL | 0 refills | Status: AC

## 2018-09-16 NOTE — Progress Notes (Signed)
   Office Visit Note   Patient: Vicki James           Date of Birth: [birth year]           MRN: [id number] Visit Date: 09/16/2018 Requested by: Darrin Nipperollege, [facility] [place] [address] [place], [place] [id number] PCP: Darrin Nipperollege, [facility] [place]  Subjective: Chief Complaint  Patient presents with  . Lower Back - Pain  . Right Hip - Pain  . Right Knee - Pain  . Larey SeatFell 09/12/18 while grocery shopping    HPI: She is a 32 year old with low back, right hip and knee pain.  On September 14 she was walking through the grocery store, slipped on wet floor and fell landing on her right side.  Immediate pain in both knees and in the lateral right hip.  She has some lower back pain as well.  Able to get up and bear weight, but walking with a limp.  She went to the ER where x-rays of her hip and knees were obtained and were negative for acute fracture.  We reviewed those films this afternoon.  No previous problems with these areas.  She was given muscle relaxant and tramadol along with ibuprofen.  Does not seem to be improving so far.  She has not been able to return to work.  She works at [employer].              ROS: She has PCOS, all other systems were reviewed and are negative.  Objective: Vital Signs: LMP 09/01/2018 (Exact Date)   Physical Exam:  Low back: No visible bruising.  She is tender over the L5-S1 area and in the right sciatic notch.  Slight tenderness over the right greater trochanter.  Lower extremity strength and reflexes are normal. Right hip: Good range of motion with internal rotation. Right knee: No effusion, ligaments feel stable.  Limited flexion because of pain.  Tender around the medial and lateral joint lines.  Imaging: Lumbar spine x-rays: Normal anatomic alignment, no obvious fracture.  Disc spaces are well-preserved.  No congenital deformities.   Assessment & Plan: 1.  About 4 days status post fall with low back and right leg pain, possible  lumbar disc protrusion -Physical therapy referral, prednisone taper, muscle relaxant as needed. -MRI scan lumbar spine if symptoms persist.   Follow-Up Instructions: No follow-ups on file.       Procedures: None today.   PMFS History: There are no active problems to display for this patient.  History reviewed. No pertinent past medical history.  History reviewed. No pertinent family history.  History reviewed. No pertinent surgical history. Social History   Occupational History  . Not on file  Tobacco Use  . Smoking status: Never Smoker  . Smokeless tobacco: Never Used  Substance and Sexual Activity  . Alcohol use: No  . Drug use: No  . Sexual activity: Not on file

## 2018-09-23 ENCOUNTER — Encounter (INDEPENDENT_AMBULATORY_CARE_PROVIDER_SITE_OTHER): Payer: Self-pay

## 2018-09-23 ENCOUNTER — Ambulatory Visit (INDEPENDENT_AMBULATORY_CARE_PROVIDER_SITE_OTHER): Payer: BLUE CROSS/BLUE SHIELD | Admitting: Family Medicine

## 2018-09-23 ENCOUNTER — Encounter (INDEPENDENT_AMBULATORY_CARE_PROVIDER_SITE_OTHER): Payer: Self-pay | Admitting: Family Medicine

## 2018-09-23 DIAGNOSIS — M25551 Pain in right hip: Secondary | ICD-10-CM | POA: Diagnosis not present

## 2018-09-23 DIAGNOSIS — M5441 Lumbago with sciatica, right side: Secondary | ICD-10-CM

## 2018-09-23 MED ORDER — ACETAMINOPHEN-CODEINE #3 300-30 MG PO TABS: 1.0000 | ORAL_TABLET | Freq: Four times a day (QID) | ORAL | 0 refills | Status: AC | PRN

## 2018-09-23 NOTE — Progress Notes (Signed)
   Office Visit Note   Patient: Vicki James           Date of Birth: 11-Feb-1986           MRN: [id number] Visit Date: 09/23/2018 Requested by: Darrin Nipperollege, [facility] [place] [address] [place], [place] [id number] PCP: Darrin Nipperollege, [facility] [place]  Subjective: Chief Complaint  Patient presents with  . Left Knee - Follow-up    Patient fell down the stairs on 09/22/2018.   Marland Kitchen. Pelvic Muscle Pain    HPI: She is here with worsening low back and right leg pain.  Yesterday while walking, she felt numbness in her foot and sudden severe pain in her right hip and thigh, her leg gave out and she fell down some steps.  She has had increasing pain since then.  She remains out of work.  Physical therapy has been scheduled to start next week.  She is still wearing her knee immobilizer most of the time.  Tramadol seems to be given her hot flashes.              ROS: Noncontributory  Objective: Vital Signs: LMP 09/01/2018 (Exact Date)   Physical Exam:  Back: Still tender to palpation in the right sciatic notch area and near the L5-S1 region.  Straight leg raise is equivocal.  Quite a bit of pain with passive internal hip rotation although her range of motion remains good.  She has pain with hip flexion and adduction against resistance.  Good strength with knee extension, ankle dorsiflexion and plantarflexion.  Imaging: None today.  Assessment & Plan: 1.  Worsening low back and right leg pain with numbness and weakness subjectively, right groin pain. -MRI lumbar spine to look for disc herniation.  MRI pelvis to look for abductor tendon tear.  Out of work until MRI results are available. -Tylenol 3 for pain.   Follow-Up Instructions: No follow-ups on file.       Procedures: None today.   PMFS History: There are no active problems to display for this patient.  History reviewed. No pertinent past medical history.  History reviewed. No pertinent family history.    History reviewed. No pertinent surgical history. Social History   Occupational History  . Not on file  Tobacco Use  . Smoking status: Never Smoker  . Smokeless tobacco: Never Used  Substance and Sexual Activity  . Alcohol use: No  . Drug use: No  . Sexual activity: Not on file

## 2018-09-30 ENCOUNTER — Other Ambulatory Visit: Payer: Self-pay

## 2018-09-30 ENCOUNTER — Ambulatory Visit: Payer: BLUE CROSS/BLUE SHIELD | Attending: Family Medicine | Admitting: Physical Therapy

## 2018-09-30 DIAGNOSIS — R262 Difficulty in walking, not elsewhere classified: Secondary | ICD-10-CM | POA: Diagnosis not present

## 2018-09-30 DIAGNOSIS — M5441 Lumbago with sciatica, right side: Secondary | ICD-10-CM | POA: Insufficient documentation

## 2018-09-30 DIAGNOSIS — R29898 Other symptoms and signs involving the musculoskeletal system: Secondary | ICD-10-CM | POA: Diagnosis not present

## 2018-09-30 DIAGNOSIS — R2689 Other abnormalities of gait and mobility: Secondary | ICD-10-CM | POA: Diagnosis not present

## 2018-09-30 DIAGNOSIS — M25551 Pain in right hip: Secondary | ICD-10-CM | POA: Diagnosis not present

## 2018-09-30 DIAGNOSIS — M25561 Pain in right knee: Secondary | ICD-10-CM | POA: Diagnosis not present

## 2018-09-30 NOTE — Therapy (Signed)
[facility] [address]  [address] [place], [place], [address] Phone: [phone]   Fax:  [phone]  Physical Therapy Evaluation  Patient Details  Name: Vicki James MRN: [id number] Date of Birth: 13-Feb-1986 Referring Provider (PT): Lawernce Keas, MD   Encounter Date: 09/30/2018  PT End of Session - 09/30/18 1324    Visit Number  1    Number of Visits  12    Date for PT Re-Evaluation  11/11/18    Authorization Type  BCBS    Authorization - Number of Visits  90    PT Start Time  0922    PT Stop Time  1028    PT Time Calculation (min)  66 min    Equipment Utilized During Treatment  Right knee immobilizer    Activity Tolerance  Patient limited by pain    Behavior During Therapy  [facility] for tasks assessed/performed       No past medical history on file.  No past surgical history on file.  There were no vitals filed for this visit.   Subjective Assessment - 09/30/18 0928    Subjective  Pt reports she was shopping at [employer] on 09/13/18, when she slipped in wet floor falling in a split fashion going down on R knee and then falling to onto R side. Noted pain in R knee, hip/groin and R side/low back and states MD told her she may have strained her "groin muscle". Pain caused her R knee to buckle last week leading to a fall down the stairs. Pt arrives to PT in R knee immobilizer positoned below knee around lower leg and resting on ankle. Pt also reporting she has crutches which she did not bring to PT because they hurt her arms to use them.    Limitations  Standing;Walking;House hold activities    How long can you stand comfortably?  5-10 min    How long can you walk comfortably?  5-10 min    Diagnostic tests  MRI for low back & hip/groin scheduled for 10/09/18    Patient Stated Goals  "pain to go away & do what I was able to do before w/p pain"    Currently in Pain?  Yes    Pain Score  8    up to 10/10   Pain Location  Groin     Pain Orientation  Right    Pain Descriptors / Indicators  --   "pulling & ripping"   Pain Type  Acute pain    Pain Radiating Towards  pain down inner R thigh down to foot, with numb/burning sensation in foot    Pain Onset  1 to 4 weeks ago    Pain Frequency  Constant    Aggravating Factors   walking, prolonged standing, sleeping    Pain Relieving Factors  Tramadol, ibprofen, Tylenol    Effect of Pain on Daily Activities  has to sleep on L side, but sleep still disrupted; limited standing tolerance; unable to drive    Multiple Pain Sites  Yes    Pain Score  0   up to 10/10   Pain Location  Back   & posterior hip   Pain Orientation  Right;Posterior;Lower    Pain Descriptors / Indicators  Sharp    Pain Type  Acute pain    Pain Radiating Towards  numbness & burning into foot    Pain Onset  1 to 4 weeks ago  Pain Frequency  Intermittent    Aggravating Factors   bending over; sleeping on back or R side; stairs    Pain Relieving Factors  Tramadol, ibprofen, Tylenol    Effect of Pain on Daily Activities  difficulty negotiating stairs to 3rd floor condo    Pain Score  7    Pain Location  Knee    Pain Orientation  Right;Anterior    Pain Descriptors / Indicators  Numbness;Burning    Pain Type  Acute pain    Pain Onset  1 to 4 weeks ago    Pain Frequency  Constant    Aggravating Factors   elevating R leg, prolonged standing or walking, climbing stairs    Pain Relieving Factors  Tramadol, ibprofen, Tylenol    Effect of Pain on Daily Activities  limited standing/walking tolerance; difficulty climbing stairs         [facility] PT Assessment - 09/30/18 0922      Assessment   Medical Diagnosis  Acute R low back, hip, groin & knee pain s/p fall    Referring Provider (PT)  Lawernce Keas, MD    Onset Date/Surgical Date  09/13/18    Next MD Visit  TBD after MRI      Precautions   Required Braces or Orthoses  Knee Immobilizer - Right    Knee Immobilizer - Right  --   used as needed for  stability     Balance Screen   Has the patient fallen in the past 6 months  Yes    How many times?  2    Has the patient had a decrease in activity level because of a fear of falling?   Yes    Is the patient reluctant to leave their home because of a fear of falling?   Yes      Home Environment   Living Environment  Private residence    Living Arrangements  Spouse/significant other    Type of Home  --   Condo   Home Access  Stairs to enter    Entrance Stairs-Number of Steps  3 flights    Entrance Stairs-Rails  Left    Home Layout  One level    Home Equipment  Crutches      Prior Function   Level of Independence  Independent    Vocation  Full time employment   out of work d/t injury   Programmer, [employer] - mostly sitting; long walk to parking    Leisure  shopping, traveling, had plans to start working out (bought gym memebership) but had not started prior to injury      Posture/Postural Control   Posture/Postural Control  Postural limitations    Postural Limitations  Anterior pelvic tilt;Flexed trunk;Increased lumbar lordosis;Weight shift left      ROM / Strength   AROM / PROM / Strength  AROM;Strength      AROM   Overall AROM   Unable to assess;Due to pain    Overall AROM Comments  Unable to assess lumbar ROM due to poor standing tolerance, as well as hip & knee ROM due to limited positonal tolerance in sitting, supine or L sidelying    AROM Assessment Site  Lumbar;Hip;Knee    Right/Left Knee  Right;Left    Right Knee Extension  35   in LAQ; 0 dg extension when suported in supine   Left Knee Extension  0      Strength   Overall Strength  Unable  to assess;Due to pain      Flexibility   Soft Tissue Assessment /Muscle Length  yes    Hamstrings  unable to tolerate assessment    Quadriceps  mod tightness B - pt reporting "burning" sensation in anterior thigh during passive knee flexion in prone    ITB  unable to tolerate assessment    Piriformis   unable to tolerate assessment      Palpation   Palpation comment  Pt denies ttp over lumbar paraspinals. Increased muscle tension with very ttp over R glutes (esp glute min/med), R trochanteric bursa, R TFL/ITB, R hip flexors/quads & R hip adductors.                Objective measurements completed on examination: See above findings.      [facility] Adult PT Treatment/Exercise - 09/30/18 0922      Modalities   Modalities  Electrical Stimulation;Moist Heat      Moist Heat Therapy   Number Minutes Moist Heat  15 Minutes    Moist Heat Location  Lumbar Spine;Hip   buttocks & R groin/hip adductors     Electrical Stimulation   Electrical Stimulation Location  R glutes & adductors    Electrical Stimulation Action  IFC    Electrical Stimulation Parameters  80-150 Hz, intensity to pt tol x15'    Electrical Stimulation Goals  Pain;Tone             PT Education - 09/30/18 1014    Education Details  PT eval findings, anticipated POC, proper application and use of R knee immobilizer, proper height adjustment and use of crutches    Person(s) Educated  Patient    Methods  Explanation;Demonstration    Comprehension  Verbalized understanding       PT Short Term Goals - 09/30/18 1028      PT SHORT TERM GOAL #1   Title  Independent with initial HEP     Status  New    Target Date  10/21/18      PT SHORT TERM GOAL #2   Title  Patient to demonstrate improved tissue quality and pliability with reduced pain     Status  New    Target Date  10/21/18        PT Long Term Goals - 09/30/18 1028      PT LONG TERM GOAL #1   Title  Independet with ongoing/adavanced HEP    Status  New    Target Date  11/11/18      PT LONG TERM GOAL #2   Title  Patient to report pain reduction in frequency and intensity by >/= 50%     Status  New    Target Date  11/11/18      PT LONG TERM GOAL #3   Title  Patient to demonstrate appropriate posture and body mechanics needed for daily activities     Status  New    Target Date  11/11/18      PT LONG TERM GOAL #4   Title  Patient to report ability to perform work and daily activities without pain provocation    Status  New    Target Date  11/11/18             Plan - 09/30/18 1015    Clinical Impression Statement  Vicki James is a 32 y/o female who presents to OP PT with R sided hip, knee and low back pain following a slip and fall on a wet  floor while shopping at [employer] on 09/13/18. She reports groin and inner thigh pain is the worst, but also reports radicular numbness and burning down R LE to foot. Pain severely limits functional mobility, gait and positional tolerance with daily activities such as sleeping. Pain also preventing completion of thorough ROM and strength assessment for low back and lower extremities as well as overall musculoskeletal system as pt only able to tolerate prone lying with distal R LE slightly off edge of table, and unable to allow PROM or special testing due to extensive guarding. Estim and moist applied in effort to reduce pain and allow for more complete assessment, but pt only noting slight improvement in pain with this. Education provided in proper use of knee immobilizer as well as proper set-up and use of crutches using clinic set - requested pt bring her crutches to next session to verify proper set-up and use of crutches. Future PT will focus on pain management to allow for further assessment of musculoskeletal impairments, allowing PT to address these deficits to decrease pain interference with daily activities.    Clinical Presentation  Evolving    Clinical Decision Making  Low    Rehab Potential  Fair    Clinical Impairments Affecting Rehab Potential  morbid obesity; limited positional tolerance    PT Frequency  2x / week    PT Duration  6 weeks    PT Treatment/Interventions  ADLs/Self Care Home Management;Cryotherapy;Electrical Stimulation;Iontophoresis 4mg /ml Dexamethasone;Moist  Heat;Traction;Ultrasound;Gait training;Stair training;Functional mobility training;Therapeutic activities;Therapeutic exercise;Balance training;Neuromuscular re-education;Patient/family education;Manual techniques;Passive range of motion;Dry needling;Taping;Vasopneumatic Device    Consulted and Agree with Plan of Care  Patient       Patient will benefit from skilled therapeutic intervention in order to improve the following deficits and impairments:  Pain, Increased muscle spasms, Impaired flexibility, Decreased range of motion, Decreased strength, Decreased activity tolerance, Decreased mobility, Difficulty walking, Abnormal gait, Improper body mechanics, Postural dysfunction  Visit Diagnosis: Pain in right hip  Acute right-sided low back pain with right-sided sciatica  Acute pain of right knee  Difficulty in walking, not elsewhere classified  Other abnormalities of gait and mobility  Other symptoms and signs involving the musculoskeletal system     Problem List There are no active problems to display for this patient.   Marry Guan, PT, MPT 09/30/2018, 1:12 PM  [facility] [address]  [address] [place], [place], [address] Phone: [phone]   Fax:  [phone]  Name: Kamrin Spath MRN: [id number] Date of Birth: [birth year]

## 2018-10-05 ENCOUNTER — Ambulatory Visit: Payer: BLUE CROSS/BLUE SHIELD | Admitting: Physical Therapy

## 2018-10-07 ENCOUNTER — Ambulatory Visit: Payer: BLUE CROSS/BLUE SHIELD | Admitting: Physical Therapy

## 2018-10-08 ENCOUNTER — Other Ambulatory Visit: Payer: BLUE CROSS/BLUE SHIELD

## 2018-10-09 ENCOUNTER — Ambulatory Visit
Admission: RE | Admit: 2018-10-09 | Discharge: 2018-10-09 | Disposition: A | Discharge status: Home / Self Care | Payer: BLUE CROSS/BLUE SHIELD | Source: Ambulatory Visit | Attending: Family Medicine | Admitting: Family Medicine

## 2018-10-09 DIAGNOSIS — M545 Low back pain: Secondary | ICD-10-CM | POA: Diagnosis not present

## 2018-10-09 DIAGNOSIS — M25551 Pain in right hip: Secondary | ICD-10-CM

## 2018-10-09 DIAGNOSIS — M5441 Lumbago with sciatica, right side: Secondary | ICD-10-CM

## 2018-10-12 ENCOUNTER — Telehealth (INDEPENDENT_AMBULATORY_CARE_PROVIDER_SITE_OTHER): Payer: Self-pay | Admitting: Family Medicine

## 2018-10-12 NOTE — Telephone Encounter (Signed)
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  Important to work on aggressive weight loss for long-term spine health.  No pinched nerves seen, no ruptured discs.  No need for surgery.    Hip MRI is normal.  Pain is probably from bruise or muscle strain, and should resolve with time and continued physical therapy.

## 2018-10-12 NOTE — Telephone Encounter (Signed)
Advised the patient of the MRI results.  She has an appointment on 10/16 with Dr. Prince Rome for followup and she  would like to keep this appointment.

## 2018-10-13 ENCOUNTER — Ambulatory Visit: Payer: BLUE CROSS/BLUE SHIELD | Admitting: Physical Therapy

## 2018-10-13 DIAGNOSIS — M25561 Pain in right knee: Secondary | ICD-10-CM | POA: Diagnosis not present

## 2018-10-13 DIAGNOSIS — M5441 Lumbago with sciatica, right side: Secondary | ICD-10-CM | POA: Diagnosis not present

## 2018-10-13 DIAGNOSIS — M25551 Pain in right hip: Secondary | ICD-10-CM

## 2018-10-13 DIAGNOSIS — R262 Difficulty in walking, not elsewhere classified: Secondary | ICD-10-CM | POA: Diagnosis not present

## 2018-10-13 DIAGNOSIS — R2689 Other abnormalities of gait and mobility: Secondary | ICD-10-CM

## 2018-10-13 DIAGNOSIS — R29898 Other symptoms and signs involving the musculoskeletal system: Secondary | ICD-10-CM | POA: Diagnosis not present

## 2018-10-13 NOTE — Therapy (Signed)
[facility] [address]  [address] [place], [place], [address] Phone: [phone]   Fax:  [phone]  MD progress note for Physical Therapy Treatment  Patient Details  Name: Vicki James MRN: [id number] Date of Birth: [birth year] Referring Provider (PT): Lawernce Keas, MD   Encounter Date: 10/13/2018  PT End of Session - 10/13/18 1652    Visit Number  2    Number of Visits  12    Date for PT Re-Evaluation  11/11/18    Authorization Type  BCBS    Authorization - Number of Visits  90    PT Start Time  1620    PT Stop Time  1659    PT Time Calculation (min)  39 min    Activity Tolerance  Patient limited by pain    Behavior During Therapy  [facility] for tasks assessed/performed       No past medical history on file.  No past surgical history on file.  There were no vitals filed for this visit.  Subjective Assessment - 10/13/18 1700    Subjective  Pt relays she still has a lot of back/hip/groin/knee pain and radiculopathy. She relays this was aggravated by getting in MRI machine recently for back/hip MRI    Pain Score  7     Pain Location  Groin   groin, back, hip   Pain Orientation  Right    Pain Descriptors / Indicators  Aching;Numbness;Throbbing    Pain Type  Acute pain    Pain Radiating Towards  Rt leg into foot    Pain Onset  1 to 4 weeks ago    Pain Frequency  Constant         [facility] PT Assessment - 10/13/18 0001      Assessment   Medical Diagnosis  Acute R low back, hip, groin & knee pain s/p fall    Referring Provider (PT)  Lawernce Keas, MD    Onset Date/Surgical Date  09/13/18    Next MD Visit  10/14/18      AROM   AROM Assessment Site  Lumbar    Lumbar Flexion  75%    Lumbar Extension  25%    Lumbar - Right Side Bend  25%    Lumbar - Left Side Bend  WNL    Lumbar - Right Rotation  25%    Lumbar - Left Rotation  50%      Strength   Overall Strength  Deficits   Leg strength overall 3+/5 grossly  but limited by pain                  [facility] Adult PT Treatment/Exercise - 10/13/18 0001      Exercises   Exercises  Lumbar      Lumbar Exercises: Stretches   Active Hamstring Stretch  Right;30 seconds;3 reps   complaint of leg numbness on 3rd rep   Other Lumbar Stretch Exercise  seated lumbar P ball roll outs for flexion 5 sec X 15      Lumbar Exercises: Aerobic   Nustep  3 min (stopped due to knee pain)      Lumbar Exercises: Seated   Long Arc Quad on Chair  10 reps;Right;Left   complains of pulling in her Rt inner thigh   Other Seated Lumbar Exercises  seated marches X 10 bilat   limited ROM due to pain   Other Seated Lumbar Exercises  seated add isometric ball squeeze 5  sec X 15      Lumbar Exercises: Sidelying   Clam  10 reps   for Rt hip     Modalities   Modalities  Electrical Stimulation;Moist Heat      Moist Heat Therapy   Number Minutes Moist Heat  15 Minutes    Moist Heat Location  Lumbar Spine;Hip      Electrical Stimulation   Electrical Stimulation Location  R glutes & adductors    Electrical Stimulation Action  IFC    Electrical Stimulation Parameters  80-150 Hz IFC to tolerance    Electrical Stimulation Goals  Pain;Tone             PT Education - 10/13/18 1651    Education Details  technique for exercises and stretches, cuing to stay in pain free ROM    Person(s) Educated  Patient    Methods  Explanation;Demonstration    Comprehension  Verbalized understanding;Returned demonstration       PT Short Term Goals - 10/13/18 1658      PT SHORT TERM GOAL #1   Title  Independent with initial HEP     Status  On-going      PT SHORT TERM GOAL #2   Title  Patient to demonstrate improved tissue quality and pliability with reduced pain     Status  On-going        PT Long Term Goals - 10/13/18 1658      PT LONG TERM GOAL #1   Title  Independet with ongoing/adavanced HEP    Status  On-going      PT LONG TERM GOAL #2   Title  Patient  to report pain reduction in frequency and intensity by >/= 50%     Status  On-going      PT LONG TERM GOAL #3   Title  Patient to demonstrate appropriate posture and body mechanics needed for daily activities    Status  On-going      PT LONG TERM GOAL #4   Title  Patient to report ability to perform work and daily activities without pain provocation    Status  On-going            Plan - 10/13/18 1653    Clinical Impression Statement  Pt continues to be limited by pain is is very guarded, she has poor tolerance to movement and activity. She continues to complain of leg radiculopathy and numbness but recent hip/lumbar MRI negative except some degenerative facet hypertrophy but this would not cause radiculopathy. She was at least able to tolerate updating her strength and ROM measurements which still show significant deficits. Session ended with MHP and E-stim in efforts to decrease pain and muscle spasms. She will continue to benefit from skilled PT to address her deficits.     Rehab Potential  Fair    Clinical Impairments Affecting Rehab Potential  morbid obesity; limited positional tolerance    PT Frequency  2x / week    PT Duration  6 weeks    PT Treatment/Interventions  ADLs/Self Care Home Management;Cryotherapy;Electrical Stimulation;Iontophoresis 4mg /ml Dexamethasone;Moist Heat;Traction;Ultrasound;Gait training;Stair training;Functional mobility training;Therapeutic activities;Therapeutic exercise;Balance training;Neuromuscular re-education;Patient/family education;Manual techniques;Passive range of motion;Dry needling;Taping;Vasopneumatic Device    PT Next Visit Plan  what did MD say? Progress sent to them on 10/13/18.    Consulted and Agree with Plan of Care  Patient       Patient will benefit from skilled therapeutic intervention in order to improve the following deficits and impairments:  Pain,  Increased muscle spasms, Impaired flexibility, Decreased range of motion, Decreased  strength, Decreased activity tolerance, Decreased mobility, Difficulty walking, Abnormal gait, Improper body mechanics, Postural dysfunction  Visit Diagnosis: Pain in right hip  Acute right-sided low back pain with right-sided sciatica  Acute pain of right knee  Difficulty in walking, not elsewhere classified  Other abnormalities of gait and mobility     Problem List There are no active problems to display for this patient.   April Manson, PT, DPT 10/13/2018, 5:06 PM  [facility] [address]  [address] [place], [place], [address] Phone: [phone]   Fax:  [phone]  Name: Arli Bree MRN: [id number] Date of Birth: April 07, 1986

## 2018-10-14 ENCOUNTER — Ambulatory Visit (INDEPENDENT_AMBULATORY_CARE_PROVIDER_SITE_OTHER): Payer: BLUE CROSS/BLUE SHIELD | Admitting: Family Medicine

## 2018-10-14 ENCOUNTER — Encounter (INDEPENDENT_AMBULATORY_CARE_PROVIDER_SITE_OTHER): Payer: Self-pay | Admitting: Family Medicine

## 2018-10-14 DIAGNOSIS — M79604 Pain in right leg: Secondary | ICD-10-CM

## 2018-10-14 DIAGNOSIS — M25551 Pain in right hip: Secondary | ICD-10-CM

## 2018-10-14 DIAGNOSIS — M5441 Lumbago with sciatica, right side: Secondary | ICD-10-CM | POA: Diagnosis not present

## 2018-10-14 NOTE — Progress Notes (Signed)
   Office Visit Note   Patient: Vicki James           Date of Birth: 01-02-1986           MRN: [id number] Visit Date: 10/14/2018 Requested by: [facility] [address] [place], [place] [address] PCP: [facility]  Subjective: Chief Complaint  Patient presents with  . followup rt. LBP, hip/groin, and knee pain    HPI: She is about a month status post fall resulting in low back and right leg pain.  Lumbar and right hip MRI scans were unrevealing other than showing advanced facet hypertrophy for age, and tabular subchondral cyst suggesting early hip arthritis.  No sign of disc herniation or tendon injury.  I reviewed the films myself as well.  Pain with intermittent numbness in her foot.  Her knee immobilizer.  She has been to a couple sessions of physical therapy and it any strengthening.              ROS: Noncontributory  Objective: Vital Signs: There were no vitals taken for this visit.  Physical Exam:  Right leg: No pain with passive internal hip rotation.  She still has some pain with hip flexion and adduction resistance but her strength is normal.  Good range of motion of her knee.  5/5 strength with ankle dorsiflexion, eversion, plantarflexion, and great toe extension.  Imaging: None today.  Assessment & Plan: 1.  Status post fall with persistent right leg pain and intermittent numbness, neurologic exam is nonfocal.  Question nerve stretch injury. -Discussed options with patient, elected to proceed with nerve conduction studies.  2.  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 -Emphasized the importance of aggressive weight loss for long-term joint health.   Follow-Up Instructions: No follow-ups on file.       Procedures: None today.   PMFS History: There are no active problems to display for this patient.  History reviewed. No pertinent  past medical history.  History reviewed. No pertinent family history.  History reviewed. No pertinent surgical history. Social History   Occupational History  . Not on file  Tobacco Use  . Smoking status: Never Smoker  . Smokeless tobacco: Never Used  Substance and Sexual Activity  . Alcohol use: No  . Drug use: No  . Sexual activity: Not on file

## 2018-10-28 ENCOUNTER — Encounter (INDEPENDENT_AMBULATORY_CARE_PROVIDER_SITE_OTHER): Payer: Self-pay | Admitting: Physical Medicine and Rehabilitation

## 2018-10-28 ENCOUNTER — Ambulatory Visit (INDEPENDENT_AMBULATORY_CARE_PROVIDER_SITE_OTHER): Payer: BLUE CROSS/BLUE SHIELD | Admitting: Physical Medicine and Rehabilitation

## 2018-10-28 ENCOUNTER — Ambulatory Visit: Payer: BLUE CROSS/BLUE SHIELD | Admitting: Physical Therapy

## 2018-10-28 DIAGNOSIS — R202 Paresthesia of skin: Secondary | ICD-10-CM

## 2018-10-28 NOTE — Progress Notes (Signed)
.  Numeric Pain Rating Scale and Functional Assessment Average Pain 10   In the last MONTH (on 0-10 scale) has pain interfered with the following?  1. General activity like being  able to carry out your everyday physical activities such as walking, climbing stairs, carrying groceries, or moving a chair?  Rating(8)    

## 2018-10-29 NOTE — Progress Notes (Signed)
Aubrielle Stroud - 32 y.o. female MRN [id number]  Date of birth: December 14, 1986  Office Visit Note: Visit Date: 10/28/2018 PCP: [facility] [place] Referred by: [facility] Judie Petit*  Subjective: Chief Complaint  Patient presents with  . Lower Back - Pain  . Right Hip - Pain  . Right Leg - Pain  . Right Foot - Numbness   HPI: Nehemiah Montee is a 32 y.o. female who comes in today At the request of Dr. Lavada Mesi for electrodiagnostic study of the right lower limb.  Patient's history is such that she had a fall while walking through the [employer] after she felt something squishy on her foot and shoe and slipped.  She reports that she and her lawyer feel like it may have been juice or coil.  She essentially reports to me that she fell to the ground on the right side and was sort of into a split formation.  This occurred either on September 14 or 15 as the emergency department note was on the 15th.  She initially saw Dr. Prince Rome in follow-up from the ED and was having acute bilateral knee pain and right hip and leg pain.  She reports pain in the lower back that refers into the right hip and groin anteriorly but also refers down the leg somewhat nondermatomal he with numbness in the right foot in all dermatomal distributions globally in the foot.  She reports that while sitting for prolonged time or driving or walking her symptoms will be worse.  She reports bending also makes it worse.  Prior to the fall she had no back or leg symptoms.  She denies any real focal weakness.  She has had no bowel or bladder changes.  She has had MRI of the lumbar spine and right hip both of which were reviewed today.  There is some facet arthropathy and mild epidural lipomatosis particularly L2-30 but no frank stenosis or nerve compression of the lumbar spine.  She has not had prior electrodiagnostic studies.  She has been taking medications including anti-inflammatory type medicines as well as muscle  relaxers and pain medication.  She has started physical therapy.  ROS Otherwise per HPI.  Assessment & Plan: Visit Diagnoses:  1. Paresthesia of skin     Plan: Impression: Essentially NORMAL electrodiagnostic study of the right lower limb.    There is no significant electrodiagnostic evidence of nerve entrapment, lumbosacral plexopathy, lumbar radiculopathy or generalized peripheral neuropathy.    As you know, purely sensory or demyelinating radiculopathies and chemical radiculitis may not be detected with this particular electrodiagnostic study.   This electrodiagnostic study cannot rule out small fiber polyneuropathy and dysesthesias from central pain sensitization syndromes such as fibromyalgia.  Myotomal referral pain from trigger points is also not excluded.   Recommendations: 1.  Follow-up with referring physician. 2.  Continue current management of symptoms.   Meds & Orders: No orders of the defined types were placed in this encounter.   Orders Placed This Encounter  Procedures  . NCV with EMG (electromyography)    Follow-up: Return for Lavada Mesi, MD.   Procedures: No procedures performed  EMG & NCV Findings: All nerve conduction studies (as indicated in the following tables) were within normal limits.    All examined muscles (as indicated in the following table) showed no evidence of electrical instability.    Impression: Essentially NORMAL electrodiagnostic study of the right lower limb.    There is no significant electrodiagnostic evidence  of nerve entrapment, lumbosacral plexopathy, lumbar radiculopathy or generalized peripheral neuropathy.    As you know, purely sensory or demyelinating radiculopathies and chemical radiculitis may not be detected with this particular electrodiagnostic study.   This electrodiagnostic study cannot rule out small fiber polyneuropathy and dysesthesias from central pain sensitization syndromes such as fibromyalgia.  Myotomal  referral pain from trigger points is also not excluded.   Recommendations: 1.  Follow-up with referring physician. 2.  Continue current management of symptoms.  ___________________________ Naaman Plummer FAAPMR Board Certified, American Board of Physical Medicine and Rehabilitation    Nerve Conduction Studies Anti Sensory Summary Table   Stim Site NR Peak (ms) Norm Peak (ms) P-T Amp (V) Norm P-T Amp Site1 Site2 Delta-P (ms) Dist (cm) Vel (m/s) Norm Vel (m/s)  Right Saphenous Anti Sensory (Ant Med Mall)  31C  14cm    3.6 <4.4 21.6 >2 14cm Ant Med Mall 3.6 0.0  >32  Right Sup Fibular Anti Sensory (Ant Lat Mall)  29.8C  14 cm    3.3 <4.4 30.2 >5.0 14 cm Ant Lat Mall 3.3 14.0 42 >32  Right Sural Anti Sensory (Lat Mall)  31C  Calf    3.6 <4.0 18.9 >5.0 Calf Lat Mall 3.6 14.0 39 >35   Motor Summary Table   Stim Site NR Onset (ms) Norm Onset (ms) O-P Amp (mV) Norm O-P Amp Site1 Site2 Delta-0 (ms) Dist (cm) Vel (m/s) Norm Vel (m/s)  Right Fibular Motor (Ext Dig Brev)  31.3C  Ankle    3.5 <6.1 7.1 >2.5 B Fib Ankle 7.4 35.0 47 >38  B Fib    10.9  5.6  Poplt B Fib 1.6 10.0 63 >40  Poplt    12.5  6.2         Right Tibial Motor (Abd Hall Brev)  30.6C  Ankle    4.1 <6.1 7.5 >3.0 Knee Ankle 8.0 40.0 50 >35  Knee    12.1  1.1          EMG   Side Muscle Nerve Root Ins Act Fibs Psw Amp Dur Poly Recrt Int Dennie Bible Comment  Right AntTibialis Dp Br Peron L4-5 Nml Nml Nml Nml Nml 0 Nml Nml   Right Fibularis Longus  Sup Br Peron L5-S1 Nml Nml Nml Nml Nml 0 Nml Nml   Right MedGastroc Tibial S1-2 Nml Nml Nml Nml Nml 0 Nml Nml   Right VastusMed Femoral L2-4 Nml Nml Nml Nml Nml 0 Nml Nml   Right BicepsFemS Sciatic L5-S1 Nml Nml Nml Nml Nml 0 Nml Nml     Nerve Conduction Studies Anti Sensory Left/Right Comparison   Stim Site L Lat (ms) R Lat (ms) L-R Lat (ms) L Amp (V) R Amp (V) L-R Amp (%) Site1 Site2 L Vel (m/s) R Vel (m/s) L-R Vel (m/s)  Saphenous Anti Sensory (Ant Med Mall)  31C  14cm  3.6    21.6  14cm Ant Med Mall     Sup Fibular Anti Sensory (Ant Lat Mall)  29.8C  14 cm  3.3   30.2  14 cm Ant Lat Mall  42   Sural Anti Sensory (Lat Mall)  31C  Calf  3.6   18.9  Calf Lat Mall  39    Motor Left/Right Comparison   Stim Site L Lat (ms) R Lat (ms) L-R Lat (ms) L Amp (mV) R Amp (mV) L-R Amp (%) Site1 Site2 L Vel (m/s) R Vel (m/s) L-R Vel (m/s)  Fibular Motor (Ext Dig  Brev)  31.3C  Ankle  3.5   7.1  B Fib Ankle  47   B Fib  10.9   5.6  Poplt B Fib  63   Poplt  12.5   6.2        Tibial Motor (Abd Hall Brev)  30.6C  Ankle  4.1   7.5  Knee Ankle  50   Knee  12.1   1.1           Waveforms:            Clinical History: MRI LUMBAR SPINE WITHOUT CONTRAST  TECHNIQUE: Multiplanar, multisequence MR imaging of the lumbar spine was performed. No intravenous contrast was administered.  COMPARISON: Lumbar radiographs 09/16/2018.  FINDINGS: Segmentation: Normal on the comparison radiographs.  Alignment: Preserved lumbar lordosis. Subtle dextroconvex lumbar curvature as seen on the upright radiographs. There is also slight retrolisthesis of L5 on S1.  Vertebrae: No marrow edema or evidence of acute osseous abnormality. Visualized bone marrow signal is within normal limits. Intact visible sacrum and SI joints.  Conus medullaris and cauda equina: Conus extends to the L1 level. No lower spinal cord or conus signal abnormality.  Paraspinal and other soft tissues: Large body habitus. Otherwise negative.  Disc levels:  T11-T12: Mild facet hypertrophy. No stenosis.  T12-L1: Mild right facet hypertrophy. No stenosis.  L1-L2: Mild facet hypertrophy greater on the right. No stenosis.  L2-L3: Mild to moderate facet hypertrophy. Mild epidural lipomatosis. No stenosis.  L3-L4: Mild to moderate facet hypertrophy. Mild far lateral disc bulging and endplate spurring. No significant stenosis.  L4-L5: Mild to moderate facet hypertrophy. Mild far lateral  disc bulging. No significant stenosis.  L5-S1: Mild facet hypertrophy. No stenosis.  IMPRESSION: 1. No acute osseous abnormality, lumbar spinal stenosis or convincing neural impingement. 2. Age advanced lower thoracic and lumbar degenerative facet hypertrophy. No significant disc degeneration.   Electronically Signed By: Odessa Fleming M.D. On: 10/09/2018 14:15  MRI right hip IMPRESSION: Tiny subchondral cyst in the periphery of the right acetabular roof consistent with degenerative disease. The examination is otherwise normal.   Electronically Signed   By: Drusilla Kanner M.D.   On: 10/09/2018 14:14   She reports that she has never smoked. She has never used smokeless tobacco. No results for input(s): HGBA1C, LABURIC in the last 8760 hours.  Objective:  VS:  HT:    WT:   BMI:     BP:   HR: bpm  TEMP: ( )  RESP:  Physical Exam  Constitutional: She is oriented to person, place, and time.  Musculoskeletal:  Patient ambulates without aid with a fairly normal gait.  Patient has BMI of 47.  She has difficulty with forward flexion and flexion of the hip as well as knee flexion.  Difficulty is pain related.  She has no atrophy of the foot intrinsic muscles or lower leg muscles bilaterally.  There is no edema or swelling.  There is no allodynia or increased sweating.  There is good distal strength with dorsiflexion plantarflexion EHL.  Patient has good strength with hip flexion.  Neurological: She is alert and oriented to person, place, and time. She exhibits normal muscle tone. Coordination normal.    Ortho Exam Imaging: No results found.  Past Medical/Family/Surgical/Social History: Medications & Allergies reviewed per EMR, new medications updated. There are no active problems to display for this patient.  History reviewed. No pertinent past medical history. History reviewed. No pertinent family history. History reviewed. No pertinent surgical history.  Social History    Occupational History  . Not on file  Tobacco Use  . Smoking status: Never Smoker  . Smokeless tobacco: Never Used  Substance and Sexual Activity  . Alcohol use: No  . Drug use: No  . Sexual activity: Not on file

## 2018-10-29 NOTE — Procedures (Signed)
EMG & NCV Findings: All nerve conduction studies (as indicated in the following tables) were within normal limits.    All examined muscles (as indicated in the following table) showed no evidence of electrical instability.    Impression: Essentially NORMAL electrodiagnostic study of the right lower limb.    There is no significant electrodiagnostic evidence of nerve entrapment, lumbosacral plexopathy, lumbar radiculopathy or generalized peripheral neuropathy.    As you know, purely sensory or demyelinating radiculopathies and chemical radiculitis may not be detected with this particular electrodiagnostic study.   This electrodiagnostic study cannot rule out small fiber polyneuropathy and dysesthesias from central pain sensitization syndromes such as fibromyalgia.  Myotomal referral pain from trigger points is also not excluded.   Recommendations: 1.  Follow-up with referring physician. 2.  Continue current management of symptoms.  ___________________________ Naaman Plummer FAAPMR Board Certified, American Board of Physical Medicine and Rehabilitation    Nerve Conduction Studies Anti Sensory Summary Table   Stim Site NR Peak (ms) Norm Peak (ms) P-T Amp (V) Norm P-T Amp Site1 Site2 Delta-P (ms) Dist (cm) Vel (m/s) Norm Vel (m/s)  Right Saphenous Anti Sensory (Ant Med Mall)  31C  14cm    3.6 <4.4 21.6 >2 14cm Ant Med Mall 3.6 0.0  >32  Right Sup Fibular Anti Sensory (Ant Lat Mall)  29.8C  14 cm    3.3 <4.4 30.2 >5.0 14 cm Ant Lat Mall 3.3 14.0 42 >32  Right Sural Anti Sensory (Lat Mall)  31C  Calf    3.6 <4.0 18.9 >5.0 Calf Lat Mall 3.6 14.0 39 >35   Motor Summary Table   Stim Site NR Onset (ms) Norm Onset (ms) O-P Amp (mV) Norm O-P Amp Site1 Site2 Delta-0 (ms) Dist (cm) Vel (m/s) Norm Vel (m/s)  Right Fibular Motor (Ext Dig Brev)  31.3C  Ankle    3.5 <6.1 7.1 >2.5 B Fib Ankle 7.4 35.0 47 >38  B Fib    10.9  5.6  Poplt B Fib 1.6 10.0 63 >40  Poplt    12.5  6.2         Right  Tibial Motor (Abd Hall Brev)  30.6C  Ankle    4.1 <6.1 7.5 >3.0 Knee Ankle 8.0 40.0 50 >35  Knee    12.1  1.1          EMG   Side Muscle Nerve Root Ins Act Fibs Psw Amp Dur Poly Recrt Int Dennie Bible Comment  Right AntTibialis Dp Br Peron L4-5 Nml Nml Nml Nml Nml 0 Nml Nml   Right Fibularis Longus  Sup Br Peron L5-S1 Nml Nml Nml Nml Nml 0 Nml Nml   Right MedGastroc Tibial S1-2 Nml Nml Nml Nml Nml 0 Nml Nml   Right VastusMed Femoral L2-4 Nml Nml Nml Nml Nml 0 Nml Nml   Right BicepsFemS Sciatic L5-S1 Nml Nml Nml Nml Nml 0 Nml Nml     Nerve Conduction Studies Anti Sensory Left/Right Comparison   Stim Site L Lat (ms) R Lat (ms) L-R Lat (ms) L Amp (V) R Amp (V) L-R Amp (%) Site1 Site2 L Vel (m/s) R Vel (m/s) L-R Vel (m/s)  Saphenous Anti Sensory (Ant Med Mall)  31C  14cm  3.6   21.6  14cm Ant Med Mall     Sup Fibular Anti Sensory (Ant Lat Mall)  29.8C  14 cm  3.3   30.2  14 cm Ant Lat Mall  42   Sural Anti Sensory (Lat Mall)  31C  Calf  3.6   18.9  Calf Lat Mall  39    Motor Left/Right Comparison   Stim Site L Lat (ms) R Lat (ms) L-R Lat (ms) L Amp (mV) R Amp (mV) L-R Amp (%) Site1 Site2 L Vel (m/s) R Vel (m/s) L-R Vel (m/s)  Fibular Motor (Ext Dig Brev)  31.3C  Ankle  3.5   7.1  B Fib Ankle  47   B Fib  10.9   5.6  Poplt B Fib  63   Poplt  12.5   6.2        Tibial Motor (Abd Hall Brev)  30.6C  Ankle  4.1   7.5  Knee Ankle  50   Knee  12.1   1.1           Waveforms:

## 2018-10-30 ENCOUNTER — Encounter (INDEPENDENT_AMBULATORY_CARE_PROVIDER_SITE_OTHER): Payer: Self-pay | Admitting: Family Medicine

## 2018-10-30 ENCOUNTER — Ambulatory Visit (INDEPENDENT_AMBULATORY_CARE_PROVIDER_SITE_OTHER): Payer: BLUE CROSS/BLUE SHIELD | Admitting: Family Medicine

## 2018-10-30 DIAGNOSIS — M5441 Lumbago with sciatica, right side: Secondary | ICD-10-CM

## 2018-10-30 DIAGNOSIS — M25551 Pain in right hip: Secondary | ICD-10-CM

## 2018-10-30 MED ORDER — GABAPENTIN 300 MG PO CAPS: ORAL_CAPSULE | ORAL | 3 refills | Status: AC

## 2018-10-30 NOTE — Progress Notes (Signed)
   Office Visit Note   Patient: Vicki James           Date of Birth: 04/14/1986           MRN: [id number] Visit Date: 10/30/2018 Requested by: [facility] [place] [address] [place], [place] [address] PCP: [facility] [place]  Subjective: Chief Complaint  Patient presents with  . Right Hip - Follow-up, Pain    Post NCS/EMG with Dr. Alvester Morin    HPI: She is about 6 weeks status post fall resulting in right lower extremity pain.  Nerve conduction studies were done which were within normal limits.  She still complains of pain on a daily basis, taking medication throughout the day.  She is been going to physical therapy intermittently, finding it difficult to make it to the appointments due to her busy work schedule.              ROS: Otherwise noncontributory  Objective: Vital Signs: There were no vitals taken for this visit.  Physical Exam:  Right leg: Still tender to palpation on the medial aspect of her thigh.  Pain with adduction against resistance.  No joint effusion in the knee.  Remainder lower extremity strength is normal.  Imaging: None today.  Assessment & Plan: 1.  6 weeks status post fall with persistent right leg pain.  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 -Continue with physical therapy.  Trial of gabapentin.  Light duty work restrictions. -Consider a trial of chiropractic if symptoms persist.   Follow-Up Instructions: No follow-ups on file.       Procedures: None today.   PMFS History: There are no active problems to display for this patient.  History reviewed. No pertinent past medical history.  History reviewed. No pertinent family history.  History reviewed. No pertinent surgical history. Social History   Occupational History  . Not on file  Tobacco Use  . Smoking status: Never Smoker  . Smokeless tobacco: Never Used  Substance and Sexual Activity  . Alcohol use: No  . Drug  use: No  . Sexual activity: Not on file

## 2018-11-03 ENCOUNTER — Encounter: Payer: Self-pay | Admitting: Physical Therapy

## 2018-11-03 ENCOUNTER — Ambulatory Visit: Payer: BLUE CROSS/BLUE SHIELD | Attending: Family Medicine | Admitting: Physical Therapy

## 2018-11-03 DIAGNOSIS — R262 Difficulty in walking, not elsewhere classified: Secondary | ICD-10-CM | POA: Diagnosis not present

## 2018-11-03 DIAGNOSIS — R2689 Other abnormalities of gait and mobility: Secondary | ICD-10-CM | POA: Diagnosis not present

## 2018-11-03 DIAGNOSIS — M25561 Pain in right knee: Secondary | ICD-10-CM | POA: Diagnosis not present

## 2018-11-03 DIAGNOSIS — R29898 Other symptoms and signs involving the musculoskeletal system: Secondary | ICD-10-CM

## 2018-11-03 DIAGNOSIS — M5441 Lumbago with sciatica, right side: Secondary | ICD-10-CM | POA: Diagnosis not present

## 2018-11-03 DIAGNOSIS — M25551 Pain in right hip: Secondary | ICD-10-CM

## 2018-11-03 NOTE — Therapy (Signed)
[facility] [address]  [address] [place], [place], [address] Phone: [phone]   Fax:  [phone]  Physical Therapy Treatment  Patient Details  Name: Vicki James MRN: [id number] Date of Birth: 05-04-1986 Referring Provider (PT): Lawernce Keas, MD   Encounter Date: 11/03/2018  PT End of Session - 11/03/18 1745    Visit Number  3    Number of Visits  12    Date for PT Re-Evaluation  11/11/18    Authorization Type  BC[employer]    Authorization - Number of Visits  90    PT Start Time  1659    PT Stop Time  1742    PT Time Calculation (min)  43 min    Activity Tolerance  Patient limited by pain;Patient tolerated treatment well    Behavior During Therapy  [facility] for tasks assessed/performed       History reviewed. No pertinent past medical history.  History reviewed. No pertinent surgical history.  There were no vitals filed for this visit.  Subjective Assessment - 11/03/18 1659    Subjective  Patient reports her job schedule has prevented her from coming to appointment. Reports she is about the same since last session. Reports she doesn't know where she put her HEP sheet and hasn't been doing her exercises.     Diagnostic tests  MRI for low back & hip/groin scheduled for 10/09/18    Patient Stated Goals  "pain to go away & do what I was able to do before w/p pain"    Currently in Pain?  Yes    Pain Score  6     Pain Location  Groin   R inner thigh   Pain Orientation  Right    Pain Descriptors / Indicators  Throbbing    Pain Type  Acute pain                       [facility] Adult PT Treatment/Exercise - 11/03/18 0001      Self-Care   Self-Care  Other Self-Care Comments    Other Self-Care Comments   edu and practice of self-STM to R buttock   pt could not tolerance d/t "pain in kneecap"     Exercises   Exercises  Lumbar;Knee/Hip      Lumbar Exercises: Stretches   Passive Hamstring Stretch  Right;Left;1  rep;20 seconds;Limitations    Passive Hamstring Stretch Limitations  supine strap; asistance required to hold R LE up; unable to tolerate on R d/t knee and hip pain      Lumbar Exercises: Aerobic   Nustep  L1 x      Lumbar Exercises: Supine   Other Supine Lumbar Exercises  windshield wipers x20 to tolerance      Knee/Hip Exercises: Stretches   Higher education careers adviser reps;30 seconds;Limitations   c/o R sided LBP but requesting to continue   Lobbyist Limitations  prone strap      Knee/Hip Exercises: Standing   Hip Flexion  Stengthening;Right;Left;1 set;20 reps;Knee bent;Limitations   c/o R thigh pain but requesting to continue   Hip Flexion Limitations  resisted marching with red TB around toes      Knee/Hip Exercises: Seated   Long Arc Quad  Strengthening;Right;10 reps;Limitations;1 set    Hamstring Curl  Right;1 set;10 reps;Limitations   pt reporting pain but requesting to continue   Hamstring Limitations  with yellow TB      Knee/Hip Exercises:  Sidelying   Clams  R side 2x5    very limited tolerance; pain in inner thigh     Knee/Hip Exercises: Prone   Hip Extension  Strengthening;Right;Left;1 set;10 reps;Limitations    Hip Extension Limitations  straight leg             PT Education - 11/03/18 1745    Education Details  initial HEP; administered yellow TB    Person(s) Educated  Patient    Methods  Explanation;Demonstration;Tactile cues;Verbal cues;Handout    Comprehension  Verbalized understanding;Returned demonstration       PT Short Term Goals - 10/13/18 1658      PT SHORT TERM GOAL #1   Title  Independent with initial HEP     Status  On-going      PT SHORT TERM GOAL #2   Title  Patient to demonstrate improved tissue quality and pliability with reduced pain     Status  On-going        PT Long Term Goals - 10/13/18 1658      PT LONG TERM GOAL #1   Title  Independet with ongoing/adavanced HEP    Status  On-going      PT LONG TERM GOAL #2    Title  Patient to report pain reduction in frequency and intensity by >/= 50%     Status  On-going      PT LONG TERM GOAL #3   Title  Patient to demonstrate appropriate posture and body mechanics needed for daily activities    Status  On-going      PT LONG TERM GOAL #4   Title  Patient to report ability to perform work and daily activities without pain provocation    Status  On-going            Plan - 11/03/18 1746    Clinical Impression Statement  Patient arrived to session with report that she has not been performing HEP and lost folder given to her at initial eval. Has had limited attendance with PT d/t work schedule. Patient extremely symptomatic with all exercises today in multiple different pain sites. Pain sites seemingly inconsistent with exercise being done. Ex. Lateral hip pain with hamstring stretch, patellar pain with ball on wall self-STM. Despite level of pain, patient reporting "I don't want to be a wimp" with exercises and requested to continue. Introduced HEP with exercises that were better tolerated today- patient reported understanding. Patient reporting 8.5/10 pain in R LE at end of session however declined modalities.     Clinical Impairments Affecting Rehab Potential  morbid obesity; limited positional tolerance    PT Treatment/Interventions  ADLs/Self Care Home Management;Cryotherapy;Electrical Stimulation;Iontophoresis 4mg /ml Dexamethasone;Moist Heat;Traction;Ultrasound;Gait training;Stair training;Functional mobility training;Therapeutic activities;Therapeutic exercise;Balance training;Neuromuscular re-education;Patient/family education;Manual techniques;Passive range of motion;Dry needling;Taping;Vasopneumatic Device    PT Next Visit Plan  re-certification on 11/09/18    Consulted and Agree with Plan of Care  Patient       Patient will benefit from skilled therapeutic intervention in order to improve the following deficits and impairments:  Pain, Increased muscle  spasms, Impaired flexibility, Decreased range of motion, Decreased strength, Decreased activity tolerance, Decreased mobility, Difficulty walking, Abnormal gait, Improper body mechanics, Postural dysfunction  Visit Diagnosis: Pain in right hip  Acute right-sided low back pain with right-sided sciatica  Acute pain of right knee  Difficulty in walking, not elsewhere classified  Other abnormalities of gait and mobility  Other symptoms and signs involving the musculoskeletal system     Problem List  There are no active problems to display for this patient.   Anette Guarneri, PT, DPT 11/03/18 5:53 PM   [facility] [address]  [address] [place], [place], [address] Phone: [phone]   Fax:  [phone]  Name: Jamariyah Johannsen MRN: [id number] Date of Birth: Aug 12, 1986

## 2018-11-10 ENCOUNTER — Ambulatory Visit (INDEPENDENT_AMBULATORY_CARE_PROVIDER_SITE_OTHER): Payer: BLUE CROSS/BLUE SHIELD | Admitting: Family Medicine

## 2018-11-16 ENCOUNTER — Ambulatory Visit: Payer: BLUE CROSS/BLUE SHIELD | Admitting: Physical Therapy

## 2018-11-16 ENCOUNTER — Encounter (INDEPENDENT_AMBULATORY_CARE_PROVIDER_SITE_OTHER): Payer: Self-pay | Admitting: Family Medicine

## 2018-11-16 ENCOUNTER — Encounter: Payer: Self-pay | Admitting: Physical Therapy

## 2018-11-16 ENCOUNTER — Ambulatory Visit (INDEPENDENT_AMBULATORY_CARE_PROVIDER_SITE_OTHER): Payer: BLUE CROSS/BLUE SHIELD | Admitting: Family Medicine

## 2018-11-16 VITALS — BP 140/77 | HR 78

## 2018-11-16 DIAGNOSIS — M25561 Pain in right knee: Secondary | ICD-10-CM | POA: Diagnosis not present

## 2018-11-16 DIAGNOSIS — R29898 Other symptoms and signs involving the musculoskeletal system: Secondary | ICD-10-CM

## 2018-11-16 DIAGNOSIS — M79604 Pain in right leg: Secondary | ICD-10-CM

## 2018-11-16 DIAGNOSIS — M25551 Pain in right hip: Secondary | ICD-10-CM

## 2018-11-16 DIAGNOSIS — M5441 Lumbago with sciatica, right side: Secondary | ICD-10-CM | POA: Diagnosis not present

## 2018-11-16 DIAGNOSIS — R262 Difficulty in walking, not elsewhere classified: Secondary | ICD-10-CM | POA: Diagnosis not present

## 2018-11-16 DIAGNOSIS — R2689 Other abnormalities of gait and mobility: Secondary | ICD-10-CM

## 2018-11-16 MED ORDER — TIZANIDINE HCL 2 MG PO TABS
2.0000 mg | ORAL_TABLET | Freq: Every evening | ORAL | 1 refills | Status: AC | PRN
Start: 2018-11-16 — End: ?

## 2018-11-16 NOTE — Therapy (Addendum)
[facility] [address]  [place], [place], [address] Phone: [phone]   Fax:  [phone]  Physical Therapy Treatment / Discharge Summary  Patient Details  Name: Vicki James MRN: [id number] Date of Birth: December 03, 1986 Referring Provider (PT): Eunice Blase, MD   Encounter Date: 11/16/2018  PT End of Session - 11/16/18 1150    Visit Number  4    Number of Visits  12    Date for PT Re-Evaluation  11/11/18    Authorization Type  [employer]    Authorization - Number of Visits  90    PT Start Time  1100    PT Stop Time  3662   sitting rest break   PT Time Calculation (min)  59 min    Activity Tolerance  Patient limited by pain;Patient tolerated treatment well    Behavior During Therapy  [facility] for tasks assessed/performed       History reviewed. No pertinent past medical history.  History reviewed. No pertinent surgical history.  Vitals:   11/16/18 1101  BP: 140/77  Pulse: 78    Subjective Assessment - 11/16/18 1101    Subjective  Patient reports that her boss is having a hard time getting off work to come to PT. Is going to the MD to get FMLA signed off on today. Has noticed sharp pain in the bottom of B feet because she is walking more and pulling in R inner thigh. Has tried to do HEP and run on treadmill but cannot tolerate it.     Diagnostic tests  MRI for low back & hip/groin scheduled for 10/09/18    Patient Stated Goals  "pain to go away & do what I was able to do before w/p pain"    Currently in Pain?  No/denies    Pain Score  5     Pain Location  Groin    Pain Orientation  Right    Pain Descriptors / Indicators  --   pulling   Pain Type  Acute pain                       [facility] Adult PT Treatment/Exercise - 11/16/18 0001      Exercises   Exercises  Lumbar;Knee/Hip      Lumbar Exercises: Stretches   Passive Hamstring Stretch  Right;Left;20 seconds;Limitations;2 reps    Passive  Hamstring Stretch Limitations  supine strap; to tolerance    Single Knee to Chest Stretch  Right;Left;20 seconds;Limitations;2 reps    Single Knee to Chest Stretch Limitations  strap; to tolerance      Lumbar Exercises: Aerobic   Nustep  L1 x 55mn      Lumbar Exercises: Supine   Clam  15 reps;Limitations    Clam Limitations  red TB around knees    Other Supine Lumbar Exercises  windshield wipers x20 to tolerance   c/o R adductor pulling     Knee/Hip Exercises: Stretches   QPersonal assistantreps;Limitations;20 seconds    Quad Stretch Limitations  prone strap; to tolerance      Knee/Hip Exercises: Standing   Functional Squat  1 set;10 reps   discontinued d/t R knee pain   Functional Squat Limitations  mini squat at counter top to tolerance    Other Standing Knee Exercises  heel/toe raise at counter top x20    Other Standing Knee Exercises  sidestepping with yellow TB around toes along counter top  4x length of counter top   cues to make toes face forward     Knee/Hip Exercises: Seated   Long Arc Quad  Strengthening;Right;10 reps;Limitations;2 sets    Long Arc Quad Weight  1 lbs.    Long [employer] Limitations  2nd set with 1#    Other Seated Knee/Hip Exercises  resisted marching with red TB around toes x20      Knee/Hip Exercises: Supine   Short Arc Quad Sets  Strengthening;Right;2 sets;10 reps;Limitations    Short Arc Quad Sets Limitations  over bolster; 2nd set with 1#      Knee/Hip Exercises: Prone   Hip Extension  Strengthening;Right;Left;1 set;10 reps;Limitations    Hip Extension Limitations  attempted to perform with bent knee- patient could not tolerate, perform with straight knee               PT Short Term Goals - 10/13/18 1658      PT SHORT TERM GOAL #1   Title  Independent with initial HEP     Status  On-going      PT SHORT TERM GOAL #2   Title  Patient to demonstrate improved tissue quality and pliability with reduced pain     Status  On-going         PT Long Term Goals - 10/13/18 1658      PT LONG TERM GOAL #1   Title  Independet with ongoing/adavanced HEP    Status  On-going      PT LONG TERM GOAL #2   Title  Patient to report pain reduction in frequency and intensity by >/= 50%     Status  On-going      PT LONG TERM GOAL #3   Title  Patient to demonstrate appropriate posture and body mechanics needed for daily activities    Status  On-going      PT LONG TERM GOAL #4   Title  Patient to report ability to perform work and daily activities without pain provocation    Status  On-going            Plan - 11/16/18 1151    Clinical Impression Statement  Patient arrived to session, reporting she has been absent from sessions lately d/t difficulty receiving clearance to leave work early. Spoke with patient about safe modes of exercise such as swimming, bicycle, Nustep, rather than running on treadmill as it provides highly compressive forces. Patient with slightly improved tolerance for therapeutic exercise today compared to last session. Still reporting R adductor "pulling" at baseline and increased with certain activities today. Able to progress quad strengthening with light weighted resistance. Patient with difficulty with prone donkey kicks for glute strengthening, better tolerance with knee straight. Patient reported lightheadedness upon standing from supine positioning. Able to continue with standing exercises but reported that she was still lightheaded and had a slight HA.  Reported that she had not had any water today. Took vitals, which were WNL and gave patient some water. Patient was able to sit until she felt able to ambulate out of the clinic. Advised patient to notify MD of this at today's appointment. Patient reported understanding.     Clinical Impairments Affecting Rehab Potential  morbid obesity; limited positional tolerance    PT Treatment/Interventions  ADLs/Self Care Home Management;Cryotherapy;Electrical  Stimulation;Iontophoresis 17m/ml Dexamethasone;Moist Heat;Traction;Ultrasound;Gait training;Stair training;Functional mobility training;Therapeutic activities;Therapeutic exercise;Balance training;Neuromuscular re-education;Patient/family education;Manual techniques;Passive range of motion;Dry needling;Taping;Vasopneumatic Device    PT Next Visit Plan  reassess compliance with HEP  Consulted and Agree with Plan of Care  Patient       Patient will benefit from skilled therapeutic intervention in order to improve the following deficits and impairments:  Pain, Increased muscle spasms, Impaired flexibility, Decreased range of motion, Decreased strength, Decreased activity tolerance, Decreased mobility, Difficulty walking, Abnormal gait, Improper body mechanics, Postural dysfunction  Visit Diagnosis: Pain in right hip  Acute right-sided low back pain with right-sided sciatica  Acute pain of right knee  Difficulty in walking, not elsewhere classified  Other abnormalities of gait and mobility  Other symptoms and signs involving the musculoskeletal system     Problem List There are no active problems to display for this patient.   Janene Harvey, PT, DPT 11/16/18 12:02 PM   [facility] [address]  [place], [place], [address] Phone: [phone]   Fax:  [phone]  Name: Vicki James MRN: [id number] Date of Birth: Jul 09, 1986  PHYSICAL THERAPY DISCHARGE SUMMARY  Visits from Start of Care: 4  Current functional level related to goals / functional outcomes:   Refer to above clinical impression for status as of last visit on 11/16/18. Pt has not scheduled any further visits in 30 days, therefore will proceed with discharge from PT for this episode.   Remaining deficits:   As above.   Education / Equipment:   HEP  Plan: Patient agrees to discharge.  Patient goals were not met. Patient is being discharged  due to not returning since the last visit.  ?????     Percival Spanish, PT, MPT 12/15/18, 3:10 PM  [facility] [address]  [place], [place], [address] Phone: [phone]   Fax:  [phone]

## 2018-11-16 NOTE — Progress Notes (Signed)
   Office Visit Note   Patient: Vicki James           Date of Birth: 1986-09-12           MRN: [id number] Visit Date: 11/16/2018 Requested by: Darrin Nipperollege, [facility] [address] [place], [place] [id number] PCP: Darrin Nipperollege, [facility] [place]  Subjective: Chief Complaint  Patient presents with  . Right Hip - Pain, Follow-up  . Right Knee - Pain, Follow-up    HPI: She is here for follow-up right leg pain.  Is been about 2 months since her injury.  She is making some progress, but not a lot.  Physical therapy helps when she goes, but she has not been able to make it consistently due to work.  She is taking a lot of ibuprofen during the daytime and at night, and it is making her have discomfort in her chest at night when she lies down.              ROS: Otherwise noncontributory  Objective: Vital Signs: There were no vitals taken for this visit.  Physical Exam:  Right leg: Still has some tenderness on the medial thigh but hip flexion and adduction strength are improving although still somewhat painful.  Remainder of lower extremity strength is normal.  Imaging: None today.  Assessment & Plan: 1.  8316-month status post fall with slowly improving right leg pain -FMLA paperwork filled out so that she can attend physical therapy twice per week. -Stop anti-inflammatories due to stomach issues. -Zanaflex at night as needed. -Follow-up in 1 month for recheck, sooner for any problems.   Follow-Up Instructions: Return in about 4 weeks (around 12/14/2018), or if symptoms worsen or fail to improve.      Procedures: No procedures performed  No notes on file    PMFS History: There are no active problems to display for this patient.  History reviewed. No pertinent past medical history.  History reviewed. No pertinent family history.  History reviewed. No pertinent surgical history. Social History   Occupational History  . Not on file  Tobacco Use    . Smoking status: Never Smoker  . Smokeless tobacco: Never Used  Substance and Sexual Activity  . Alcohol use: No  . Drug use: No  . Sexual activity: Not on file

## 2018-12-22 DIAGNOSIS — Z01818 Encounter for other preprocedural examination: Secondary | ICD-10-CM | POA: Diagnosis not present

## 2018-12-22 DIAGNOSIS — Z419 Encounter for procedure for purposes other than remedying health state, unspecified: Secondary | ICD-10-CM | POA: Diagnosis not present

## 2019-01-23 DIAGNOSIS — Z91040 Latex allergy status: Secondary | ICD-10-CM | POA: Diagnosis not present

## 2019-01-23 DIAGNOSIS — G8918 Other acute postprocedural pain: Secondary | ICD-10-CM | POA: Diagnosis not present

## 2019-01-23 DIAGNOSIS — M79652 Pain in left thigh: Secondary | ICD-10-CM | POA: Diagnosis not present

## 2019-01-23 DIAGNOSIS — Z880 Allergy status to penicillin: Secondary | ICD-10-CM | POA: Diagnosis not present

## 2019-01-23 DIAGNOSIS — M79651 Pain in right thigh: Secondary | ICD-10-CM | POA: Diagnosis not present

## 2019-01-23 DIAGNOSIS — Z48817 Encounter for surgical aftercare following surgery on the skin and subcutaneous tissue: Secondary | ICD-10-CM | POA: Diagnosis not present

## 2019-01-28 DIAGNOSIS — M79602 Pain in left arm: Secondary | ICD-10-CM | POA: Diagnosis not present

## 2019-01-28 DIAGNOSIS — M79605 Pain in left leg: Secondary | ICD-10-CM | POA: Diagnosis not present

## 2019-01-28 DIAGNOSIS — M79601 Pain in right arm: Secondary | ICD-10-CM | POA: Diagnosis not present

## 2019-01-28 DIAGNOSIS — M79604 Pain in right leg: Secondary | ICD-10-CM | POA: Diagnosis not present

## 2019-02-03 DIAGNOSIS — R21 Rash and other nonspecific skin eruption: Secondary | ICD-10-CM | POA: Diagnosis not present

## 2019-03-04 DIAGNOSIS — R112 Nausea with vomiting, unspecified: Secondary | ICD-10-CM | POA: Diagnosis not present

## 2019-03-04 DIAGNOSIS — K219 Gastro-esophageal reflux disease without esophagitis: Secondary | ICD-10-CM | POA: Diagnosis not present

## 2019-03-04 DIAGNOSIS — M79605 Pain in left leg: Secondary | ICD-10-CM | POA: Diagnosis not present

## 2019-03-04 DIAGNOSIS — R42 Dizziness and giddiness: Secondary | ICD-10-CM | POA: Diagnosis not present

## 2019-03-12 DIAGNOSIS — R42 Dizziness and giddiness: Secondary | ICD-10-CM | POA: Diagnosis not present

## 2019-05-25 DIAGNOSIS — N926 Irregular menstruation, unspecified: Secondary | ICD-10-CM | POA: Diagnosis not present

## 2019-05-25 DIAGNOSIS — Z113 Encounter for screening for infections with a predominantly sexual mode of transmission: Secondary | ICD-10-CM | POA: Diagnosis not present

## 2019-05-25 DIAGNOSIS — Z01419 Encounter for gynecological examination (general) (routine) without abnormal findings: Secondary | ICD-10-CM | POA: Diagnosis not present

## 2019-05-25 DIAGNOSIS — Z3202 Encounter for pregnancy test, result negative: Secondary | ICD-10-CM | POA: Diagnosis not present

## 2019-05-25 DIAGNOSIS — N760 Acute vaginitis: Secondary | ICD-10-CM | POA: Diagnosis not present

## 2019-05-25 DIAGNOSIS — N979 Female infertility, unspecified: Secondary | ICD-10-CM | POA: Diagnosis not present

## 2019-05-25 DIAGNOSIS — Z6841 Body Mass Index (BMI) 40.0 and over, adult: Secondary | ICD-10-CM | POA: Diagnosis not present

## 2019-05-25 DIAGNOSIS — Z319 Encounter for procreative management, unspecified: Secondary | ICD-10-CM | POA: Diagnosis not present

## 2019-06-04 DIAGNOSIS — J029 Acute pharyngitis, unspecified: Secondary | ICD-10-CM | POA: Diagnosis not present

## 2019-06-04 DIAGNOSIS — R509 Fever, unspecified: Secondary | ICD-10-CM | POA: Diagnosis not present

## 2019-06-04 DIAGNOSIS — Z20828 Contact with and (suspected) exposure to other viral communicable diseases: Secondary | ICD-10-CM | POA: Diagnosis not present

## 2019-06-04 DIAGNOSIS — R197 Diarrhea, unspecified: Secondary | ICD-10-CM | POA: Diagnosis not present

## 2019-06-08 DIAGNOSIS — R197 Diarrhea, unspecified: Secondary | ICD-10-CM | POA: Diagnosis not present

## 2019-06-08 DIAGNOSIS — Z20828 Contact with and (suspected) exposure to other viral communicable diseases: Secondary | ICD-10-CM | POA: Diagnosis not present

## 2019-06-08 DIAGNOSIS — J029 Acute pharyngitis, unspecified: Secondary | ICD-10-CM | POA: Diagnosis not present

## 2019-06-08 DIAGNOSIS — R509 Fever, unspecified: Secondary | ICD-10-CM | POA: Diagnosis not present

## 2020-01-20 DIAGNOSIS — Z20828 Contact with and (suspected) exposure to other viral communicable diseases: Secondary | ICD-10-CM | POA: Diagnosis not present

## 2020-01-20 DIAGNOSIS — Z03818 Encounter for observation for suspected exposure to other biological agents ruled out: Secondary | ICD-10-CM | POA: Diagnosis not present

## 2020-05-09 IMAGING — MR MR HIP*R* W/O CM
5 of 6 series · 30 of 40 positions shown · non-contrast
Comparison: Plain films right hip 09/13/2018

CLINICAL DATA: Low back and right hip pain since a fall 09/13/2018.
Initial encounter.

EXAM:
MR OF THE RIGHT HIP WITHOUT CONTRAST
TECHNIQUE: Multiplanar, multisequence MR imaging was performed. No intravenous
contrast was administered.

[Series 5: T2 fat-sat · coronal · 4.0mm · 0.74mm/px · 6 of 20 slices shown (1 of 3)]
[im 1/20]
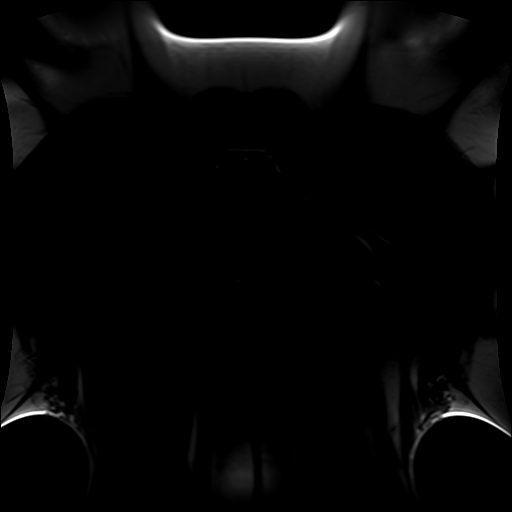
[im 4/20]
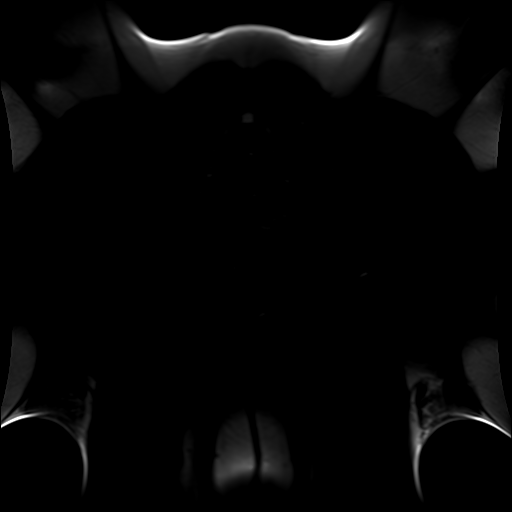
[im 8/20]
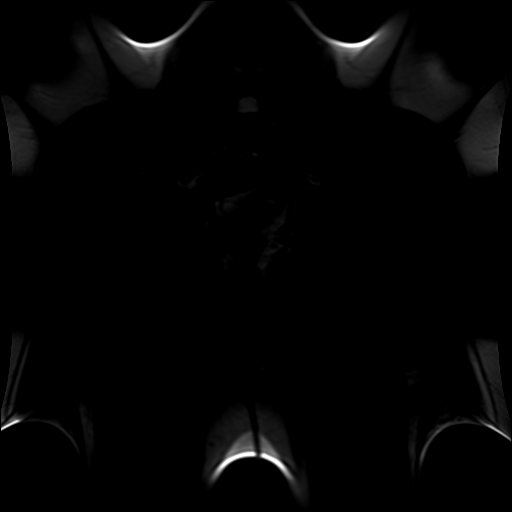
[im 12/20]
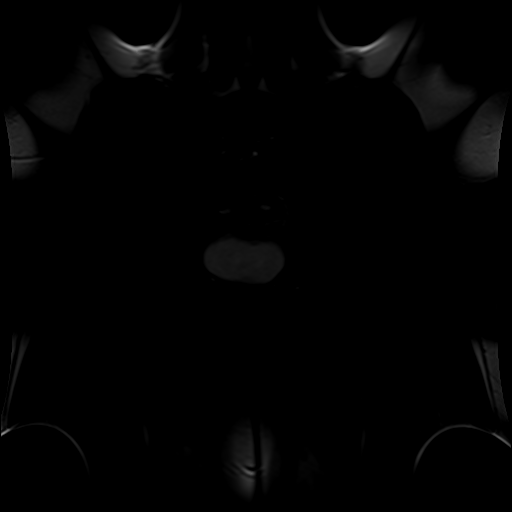
[im 16/20]
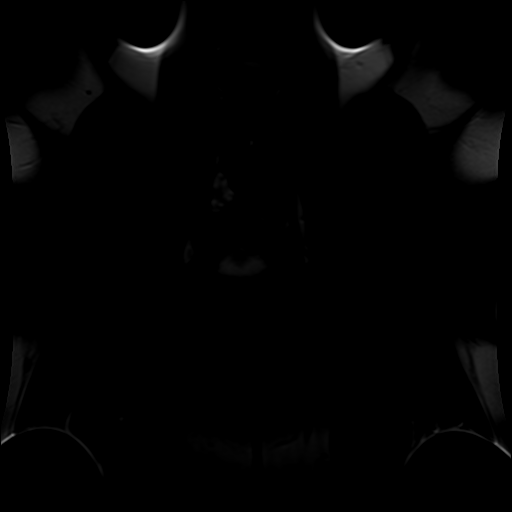
[im 20/20]
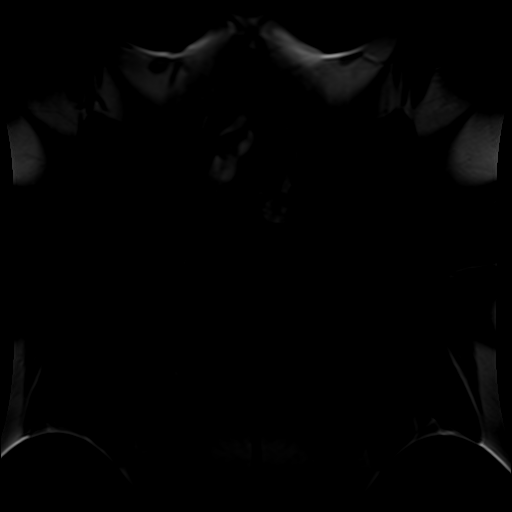

[Series 6: T2 fat-sat · axial · 4.0mm · 0.35mm/px · z∈[-71,+44]mm · 7 of 24 slices shown (2 of 3)]
[im 1/24]
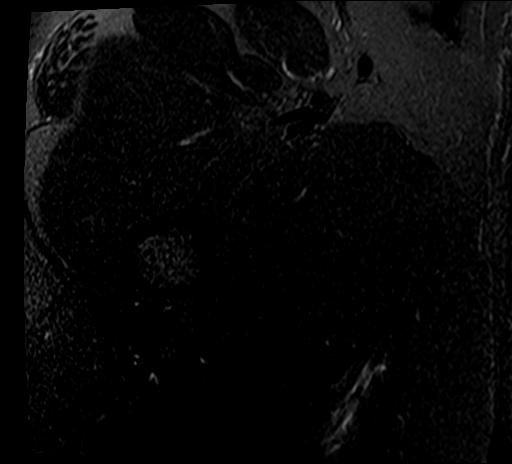
[im 4/24]
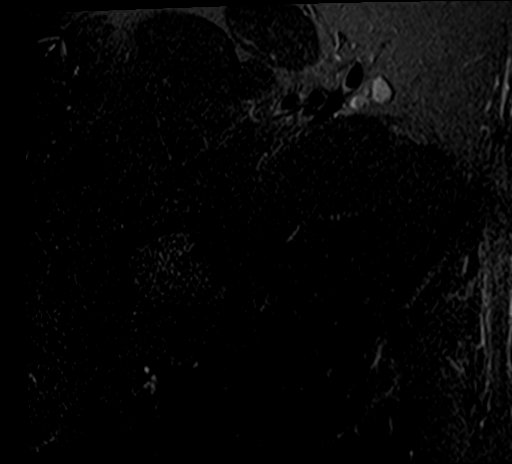
[im 8/24]
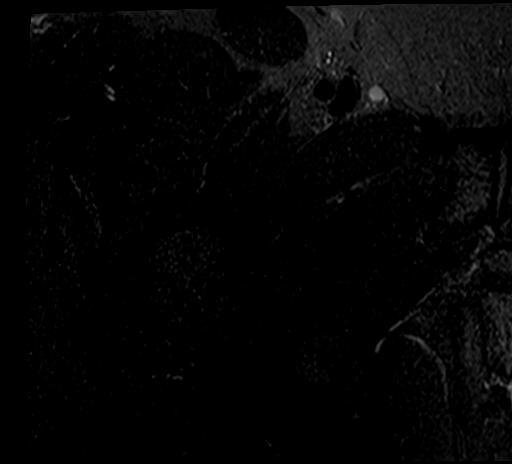
[im 12/24]
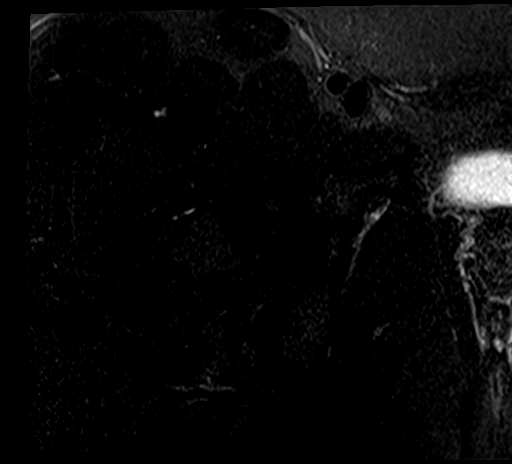
[im 16/24]
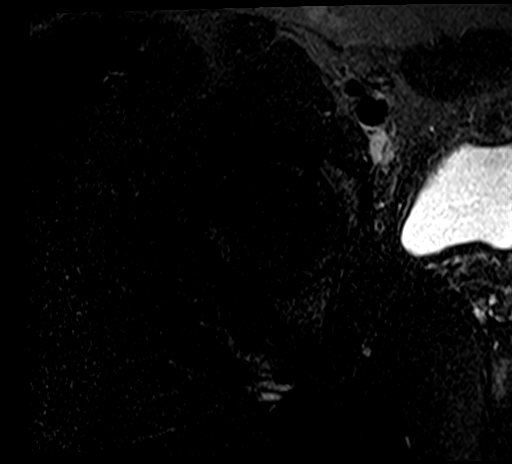
[im 20/24]
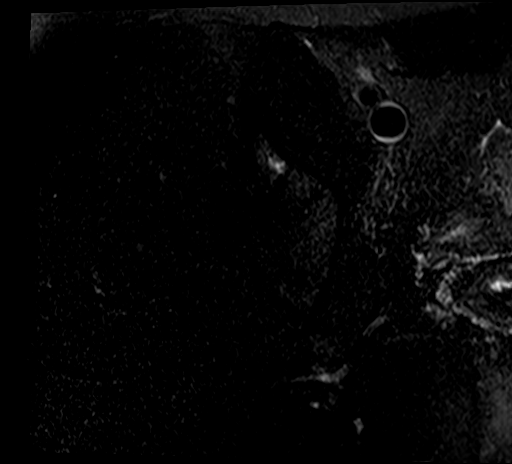
[im 24/24]
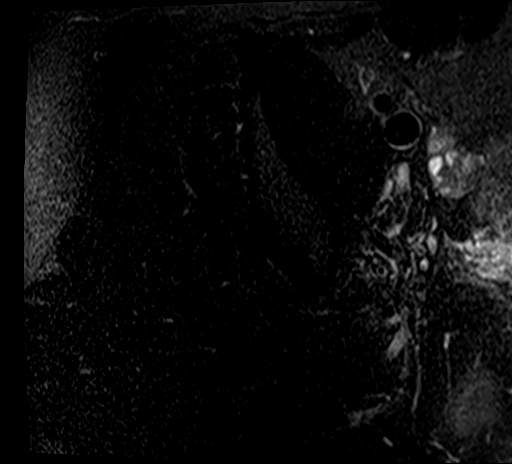

[Series 7: PD fat-sat · coronal · 4.0mm · 0.70mm/px · 5 of 17 slices shown (1 of 2)]
[im 1/17]
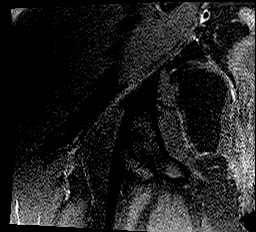
[im 5/17]
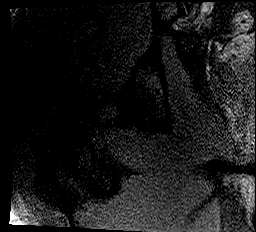
[im 9/17]
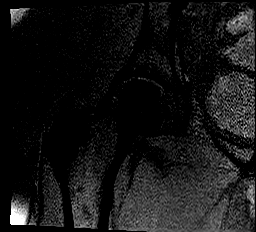
[im 13/17]
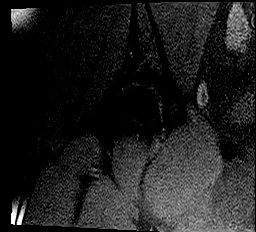
[im 17/17]
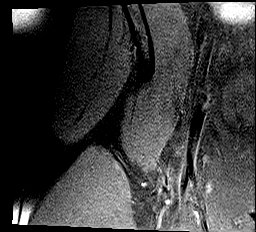

[Series 8: T2 fat-sat · axial · 4.0mm · 0.44mm/px · z∈[-71,-16]mm · 4 of 24 slices shown (3 of 3)]
[im 1/24]
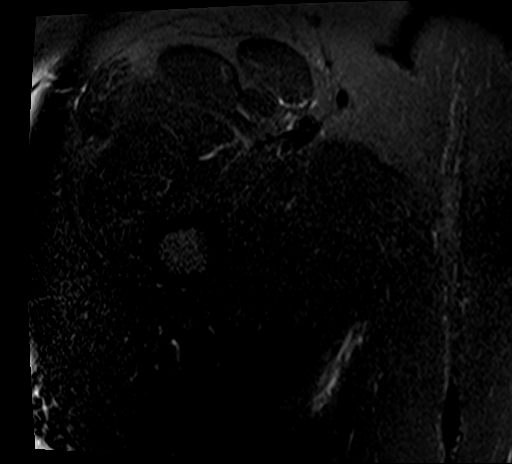
[im 4/24]
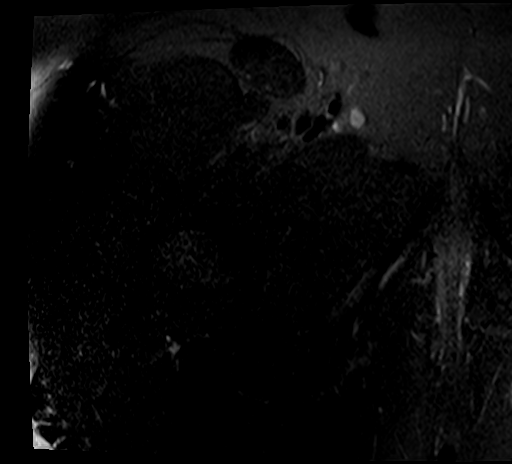
[im 8/24]
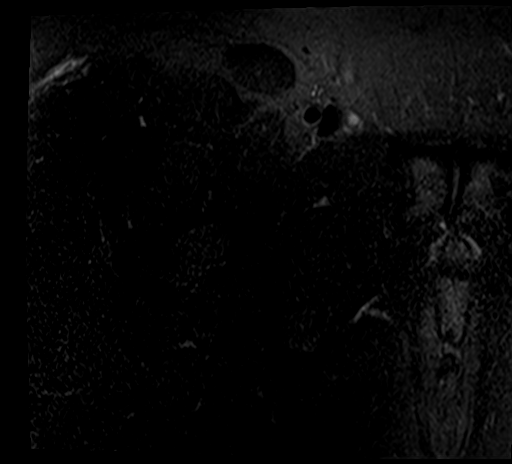
[im 12/24]
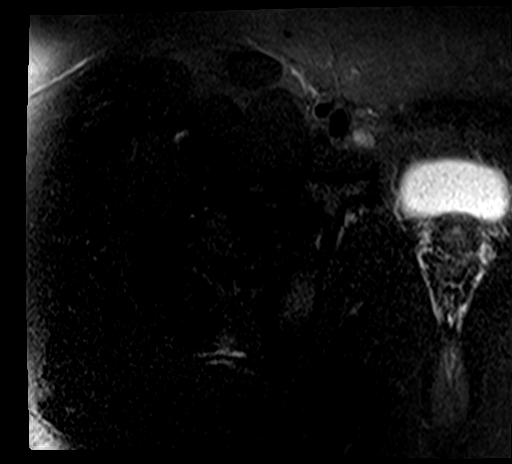

[Series 11: PD fat-sat · sagittal · 4.0mm · 0.70mm/px · 8 of 27 slices shown (2 of 2)]
[im 1/27]
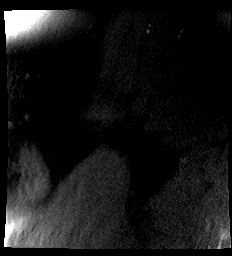
[im 4/27]
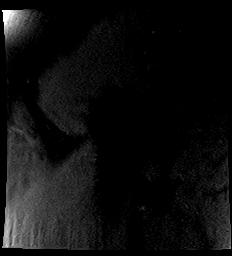
[im 8/27]
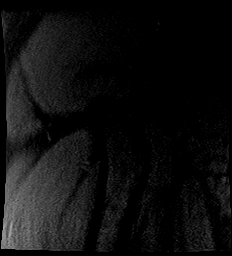
[im 12/27]
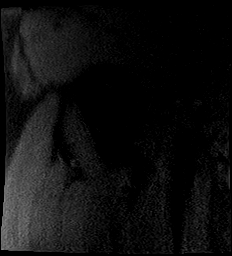
[im 15/27]
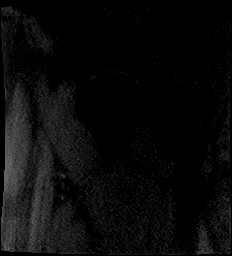
[im 19/27]
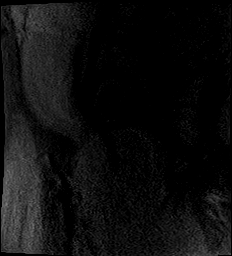
[im 23/27]
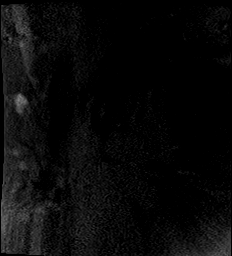
[im 27/27]
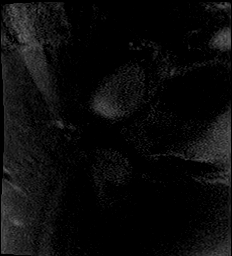

[30 of 40 positions shown; findings below may reference images not displayed]

FINDINGS: Bones: No fracture, stress change or focal lesion is seen. No
avascular necrosis of the femoral heads. A very small subchondral
cyst is seen in the anterior aspect of the right acetabular roof.

Articular cartilage and labrum

Articular cartilage:  Appears normal.

Labrum:  Intact.

Joint or bursal effusion

Joint effusion:  None.

Bursae: Negative.

Muscles and tendons

Muscles and tendons:  Intact and normal in appearance.

Other findings

Miscellaneous:   Imaged intrapelvic contents are unremarkable.
IMPRESSION: Tiny subchondral cyst in the periphery of the right acetabular roof
consistent with degenerative disease. The examination is otherwise
normal.

## 2024-01-28 ENCOUNTER — Other Ambulatory Visit: Payer: Self-pay | Admitting: Student

## 2024-01-28 DIAGNOSIS — R110 Nausea: Secondary | ICD-10-CM

## 2024-02-05 ENCOUNTER — Ambulatory Visit
Admission: RE | Admit: 2024-02-05 | Discharge: 2024-02-05 | Disposition: A | Discharge status: Home / Self Care | Payer: 59 | Source: Ambulatory Visit | Attending: Student | Admitting: Student

## 2024-02-05 DIAGNOSIS — R110 Nausea: Secondary | ICD-10-CM

## 2024-03-05 ENCOUNTER — Other Ambulatory Visit (HOSPITAL_COMMUNITY): Payer: Self-pay | Admitting: Student

## 2024-03-05 DIAGNOSIS — R110 Nausea: Secondary | ICD-10-CM

## 2024-04-23 ENCOUNTER — Encounter (HOSPITAL_COMMUNITY)
Admission: RE | Admit: 2024-04-23 | Discharge: 2024-04-23 | Disposition: A | Discharge status: Home / Self Care | Source: Ambulatory Visit | Attending: Student | Admitting: Student

## 2024-04-23 DIAGNOSIS — R110 Nausea: Secondary | ICD-10-CM | POA: Insufficient documentation

## 2024-04-23 MED ORDER — TECHNETIUM TC 99M SULFUR COLLOID: 2.0000 | Freq: Once | INTRAVENOUS | Status: DC
